# Patient Record
Sex: Female | Born: 1935 | Race: White | Hispanic: No | Marital: Married | Smoking: Former smoker
Health system: Southern US, Community
[De-identification: ages and names within clinical notes are randomized; demographics above are authoritative.]

## PROBLEM LIST (undated history)

## (undated) DIAGNOSIS — L89159 Pressure ulcer of sacral region, unspecified stage: Secondary | ICD-10-CM

## (undated) DIAGNOSIS — E119 Type 2 diabetes mellitus without complications: Secondary | ICD-10-CM

## (undated) DIAGNOSIS — M1990 Unspecified osteoarthritis, unspecified site: Secondary | ICD-10-CM

## (undated) DIAGNOSIS — F0390 Unspecified dementia without behavioral disturbance: Secondary | ICD-10-CM

---

## 2006-04-04 ENCOUNTER — Encounter: Payer: Self-pay | Admitting: Internal Medicine

## 2006-04-30 ENCOUNTER — Encounter: Payer: Self-pay | Admitting: Internal Medicine

## 2006-05-31 ENCOUNTER — Encounter: Payer: Self-pay | Admitting: Internal Medicine

## 2006-06-30 ENCOUNTER — Encounter: Payer: Self-pay | Admitting: Internal Medicine

## 2006-07-31 ENCOUNTER — Encounter: Payer: Self-pay | Admitting: Internal Medicine

## 2006-12-11 ENCOUNTER — Encounter: Payer: Self-pay | Admitting: Internal Medicine

## 2006-12-30 ENCOUNTER — Encounter: Payer: Self-pay | Admitting: Internal Medicine

## 2009-10-16 ENCOUNTER — Ambulatory Visit: Payer: Self-pay | Admitting: Ophthalmology

## 2009-10-23 ENCOUNTER — Ambulatory Visit: Payer: Self-pay | Admitting: Ophthalmology

## 2013-09-20 ENCOUNTER — Ambulatory Visit: Payer: Self-pay | Admitting: Podiatry

## 2014-07-14 ENCOUNTER — Encounter: Payer: Self-pay | Admitting: Podiatry

## 2014-07-14 ENCOUNTER — Ambulatory Visit (INDEPENDENT_AMBULATORY_CARE_PROVIDER_SITE_OTHER): Payer: Medicare Other | Admitting: Podiatry

## 2014-07-14 VITALS — BP 114/73 | HR 86 | Resp 16 | Ht 64.0 in | Wt 157.0 lb

## 2014-07-14 DIAGNOSIS — B351 Tinea unguium: Secondary | ICD-10-CM

## 2014-07-14 DIAGNOSIS — M79676 Pain in unspecified toe(s): Secondary | ICD-10-CM

## 2014-07-14 NOTE — Patient Instructions (Signed)
Diabetes and Foot Care Diabetes may cause you to have problems because of poor blood supply (circulation) to your feet and legs. This may cause the skin on your feet to become thinner, break easier, and heal more slowly. Your skin may become dry, and the skin may peel and crack. You may also have nerve damage in your legs and feet causing decreased feeling in them. You may not notice minor injuries to your feet that could lead to infections or more serious problems. Taking care of your feet is one of the most important things you can do for yourself.  HOME CARE INSTRUCTIONS  Wear shoes at all times, even in the house. Do not go barefoot. Bare feet are easily injured.  Check your feet daily for blisters, cuts, and redness. If you cannot see the bottom of your feet, use a mirror or ask someone for help.  Wash your feet with warm water (do not use hot water) and mild soap. Then pat your feet and the areas between your toes until they are completely dry. Do not soak your feet as this can dry your skin.  Apply a moisturizing lotion or petroleum jelly (that does not contain alcohol and is unscented) to the skin on your feet and to dry, brittle toenails. Do not apply lotion between your toes.  Trim your toenails straight across. Do not dig under them or around the cuticle. File the edges of your nails with an emery board or nail file.  Do not cut corns or calluses or try to remove them with medicine.  Wear clean socks or stockings every day. Make sure they are not too tight. Do not wear knee-high stockings since they may decrease blood flow to your legs.  Wear shoes that fit properly and have enough cushioning. To break in new shoes, wear them for just a few hours a day. This prevents you from injuring your feet. Always look in your shoes before you put them on to be sure there are no objects inside.  Do not cross your legs. This may decrease the blood flow to your feet.  If you find a minor scrape,  cut, or break in the skin on your feet, keep it and the skin around it clean and dry. These areas may be cleansed with mild soap and water. Do not cleanse the area with peroxide, alcohol, or iodine.  When you remove an adhesive bandage, be sure not to damage the skin around it.  If you have a wound, look at it several times a day to make sure it is healing.  Do not use heating pads or hot water bottles. They may burn your skin. If you have lost feeling in your feet or legs, you may not know it is happening until it is too late.  Make sure your health care provider performs a complete foot exam at least annually or more often if you have foot problems. Report any cuts, sores, or bruises to your health care provider immediately. SEEK MEDICAL CARE IF:   You have an injury that is not healing.  You have cuts or breaks in the skin.  You have an ingrown nail.  You notice redness on your legs or feet.  You feel burning or tingling in your legs or feet.  You have pain or cramps in your legs and feet.  Your legs or feet are numb.  Your feet always feel cold. SEEK IMMEDIATE MEDICAL CARE IF:   There is increasing redness,   swelling, or pain in or around a wound.  There is a red line that goes up your leg.  Pus is coming from a wound.  You develop a fever or as directed by your health care provider.  You notice a bad smell coming from an ulcer or wound. Document Released: 09/13/2000 Document Revised: 05/19/2013 Document Reviewed: 02/23/2013 ExitCare Patient Information 2015 [employer]. This information is not intended to replace advice given to you by your health care provider. Make sure you discuss any questions you have with your health care provider.  

## 2014-07-17 NOTE — Progress Notes (Signed)
Patient ID: Claire Williams, female   DOB: 1934-09-26, 80 y.o.   MRN: [id number]  Subjective: Ms. Claire Williams, 80 year old female, presents the office they for diabetic risk assessment and for painful elongated nails. Nails are particularly painful with shoe gear. Denies any acute changes since last appointment. No other complaints at this time.  Objective: AAO x3, NAD DP/PT pulses palpable bilaterally, CRT less than 3 seconds Protective sensation intact with Simms Weinstein monofilament, vibratory sensation intact, Achilles tendon reflex intact Nails hypertrophic, dystrophic, elongated, brittle. No surrounding erythema or drainage. No open lesions. No calf pain, swelling, warmth.  Assessment: 80 year old female with symptomatic onychomycosis  Plan: -Various she options discussed including alternatives, risks, complications. -Nail sharply debrided x10 without complications. -Discussed the importance of daily foot inspection. -Followup in 3 months or sooner if any problems are to arise or any changes symptoms. In the meantime call the office with any questions, concerns

## 2014-10-13 ENCOUNTER — Ambulatory Visit: Payer: Medicare Other | Admitting: Podiatry

## 2014-12-01 ENCOUNTER — Ambulatory Visit: Payer: Self-pay | Admitting: Podiatry

## 2015-03-21 ENCOUNTER — Ambulatory Visit (INDEPENDENT_AMBULATORY_CARE_PROVIDER_SITE_OTHER): Payer: Medicare Other | Admitting: Podiatry

## 2015-03-21 DIAGNOSIS — M79676 Pain in unspecified toe(s): Secondary | ICD-10-CM | POA: Diagnosis not present

## 2015-03-21 DIAGNOSIS — B351 Tinea unguium: Secondary | ICD-10-CM

## 2015-03-21 NOTE — Progress Notes (Signed)
Patient ID: Claire Williams, female   DOB: 04-12-1934, 80 y.o.   MRN: [id number]  Subjective: Claire Williams, 81 year old female, returns to the office today for diabetic risk assessment and for painful elongated nails which she is unable to trim herself. She denies any redness or drainage along the nail sites.  Denies any acute changes since last appointment. No other complaints at this time.  Objective: AAO x3, NAD DP/PT pulses palpable bilaterally, CRT less than 3 seconds Protective sensation intact with Simms Weinstein monofilament, vibratory sensation intact, Achilles tendon reflex intact Nails hypertrophic, dystrophic, elongated, brittle x 10. There is no surrounding erythema or drainage along the nail sites. There is tenderness to the nails 1-5 bilaterally.  No open lesions or pre-ulcerative lesions bilaterally.  No calf pain with compression, swelling, warmth, erythema.   Assessment: 81 year old female with symptomatic onychomycosis  Plan: -Various she options discussed including alternatives, risks, complications. -Nail sharply debrided x10 without complications/bleeding.  -Discussed the importance of daily foot inspection. -Followup in 3 months or sooner if any problems are to arise or any changes symptoms. In the meantime call the office with any questions, concerns  Ovid Curd, DPM

## 2015-05-05 ENCOUNTER — Telehealth: Payer: Self-pay | Admitting: *Deleted

## 2015-05-05 NOTE — Telephone Encounter (Signed)
Ms. Claire Williams states pt's husband forgot to mention at the last visit, pt has toe fungus.  Please call rx to [facility].

## 2015-05-08 MED ORDER — CICLOPIROX 8 % EX SOLN: Freq: Every day | CUTANEOUS | Status: AC

## 2015-05-08 NOTE — Telephone Encounter (Signed)
Sent over Rx for Penlac to pharmacy and notified patients caregiver, Bonita Quin.

## 2015-05-08 NOTE — Telephone Encounter (Signed)
Please advise 

## 2015-05-08 NOTE — Addendum Note (Signed)
Addended by: Kristian Covey on: 05/08/2015 02:44 PM   Modules accepted: Orders

## 2015-05-08 NOTE — Telephone Encounter (Signed)
Can try penlac or OTC fungi-nail

## 2015-06-26 ENCOUNTER — Ambulatory Visit (INDEPENDENT_AMBULATORY_CARE_PROVIDER_SITE_OTHER): Payer: Medicare Other | Admitting: Podiatry

## 2015-06-26 DIAGNOSIS — B351 Tinea unguium: Secondary | ICD-10-CM

## 2015-06-26 DIAGNOSIS — M79676 Pain in unspecified toe(s): Secondary | ICD-10-CM | POA: Diagnosis not present

## 2015-06-26 NOTE — Progress Notes (Signed)
Patient ID: Claire Williams, female   DOB: 09-03-1934, 81 y.o.   MRN: [id number]  Subjective: Claire Williams, 81 year old female, returns to the office today for diabetic risk assessment and for painful elongated nails which she is unable to trim herself. She denies any redness or drainage along the nail sites.  Denies any acute changes since last appointment. No other complaints at this time.  Objective: AAO x3, NAD DP/PT pulses palpable bilaterally, CRT less than 3 seconds Protective sensation intact with Simms Weinstein monofilament, vibratory sensation intact, Achilles tendon reflex intact Nails hypertrophic, dystrophic, elongated, brittle x 10. There is no surrounding erythema or drainage along the nail sites. There is tenderness to the nails 1-5 bilaterally.  No open lesions or pre-ulcerative lesions bilaterally.  No calf pain with compression, swelling, warmth, erythema.   Assessment: 81 year old female with symptomatic onychomycosis  Plan: -Various she options discussed including alternatives, risks, complications. -Nail sharply debrided x10 without complications/bleeding.  -Discussed the importance of daily foot inspection. -Followup in 3 months or sooner if any problems are to arise or any changes symptoms. In the meantime call the office with any questions, concerns  3 mo.  Arbutus Ped DPM

## 2015-09-27 ENCOUNTER — Encounter: Payer: Medicare Other | Admitting: Podiatry

## 2015-09-27 ENCOUNTER — Encounter: Payer: Self-pay | Admitting: Podiatry

## 2015-09-27 NOTE — Progress Notes (Signed)
This encounter was created in error - please disregard.

## 2016-06-25 ENCOUNTER — Encounter: Payer: Self-pay | Admitting: Podiatry

## 2016-06-25 ENCOUNTER — Ambulatory Visit (INDEPENDENT_AMBULATORY_CARE_PROVIDER_SITE_OTHER): Payer: Medicare Other | Admitting: Podiatry

## 2016-06-25 DIAGNOSIS — L609 Nail disorder, unspecified: Secondary | ICD-10-CM

## 2016-06-25 DIAGNOSIS — M79609 Pain in unspecified limb: Principal | ICD-10-CM

## 2016-06-25 DIAGNOSIS — M79676 Pain in unspecified toe(s): Secondary | ICD-10-CM

## 2016-06-25 DIAGNOSIS — L603 Nail dystrophy: Secondary | ICD-10-CM

## 2016-06-25 DIAGNOSIS — B351 Tinea unguium: Secondary | ICD-10-CM

## 2016-06-25 DIAGNOSIS — L608 Other nail disorders: Secondary | ICD-10-CM

## 2016-06-25 NOTE — Progress Notes (Signed)
SUBJECTIVE Patient  presents to office today complaining of elongated, thickened nails. Pain while ambulating in shoes. Patient is unable to trim their own nails.   OBJECTIVE General Patient is awake, alert, and oriented x 3 and in no acute distress. Derm Skin is dry and supple bilateral. Negative open lesions or macerations. Remaining integument unremarkable. Nails are tender, long, thickened and dystrophic with subungual debris, consistent with onychomycosis, 1-5 bilateral. No signs of infection noted. Vasc  DP and PT pedal pulses palpable bilaterally. Temperature gradient within normal limits.  Neuro Epicritic and protective threshold sensation diminished bilaterally.  Musculoskeletal Exam No symptomatic pedal deformities noted bilateral. Muscular strength within normal limits.  ASSESSMENT 1. Onychodystrophic nails 1-5 bilateral with hyperkeratosis of nails.  2. Onychomycosis of nail due to dermatophyte bilateral 3. Pain in foot bilateral  PLAN OF CARE 1. Patient evaluated today.  2. Instructed to maintain good pedal hygiene and foot care.  3. Mechanical debridement of nails 1-5 bilaterally performed using a nail nipper. Filed with dremel without incident.  4. Return to clinic in 3 mos.    Amiayah Giebel M Reata Petrov, DPM    

## 2016-09-24 ENCOUNTER — Ambulatory Visit: Payer: Medicare Other | Admitting: Podiatry

## 2016-11-21 ENCOUNTER — Ambulatory Visit: Payer: Medicare Other | Admitting: Podiatry

## 2016-11-29 ENCOUNTER — Ambulatory Visit: Payer: Medicare Other | Admitting: Podiatry

## 2016-12-10 ENCOUNTER — Ambulatory Visit: Payer: Medicare Other | Admitting: Podiatry

## 2016-12-31 ENCOUNTER — Ambulatory Visit (INDEPENDENT_AMBULATORY_CARE_PROVIDER_SITE_OTHER): Payer: Medicare Other | Admitting: Podiatry

## 2016-12-31 DIAGNOSIS — L603 Nail dystrophy: Secondary | ICD-10-CM

## 2016-12-31 DIAGNOSIS — E0843 Diabetes mellitus due to underlying condition with diabetic autonomic (poly)neuropathy: Secondary | ICD-10-CM

## 2016-12-31 DIAGNOSIS — L608 Other nail disorders: Secondary | ICD-10-CM

## 2016-12-31 DIAGNOSIS — M79609 Pain in unspecified limb: Secondary | ICD-10-CM | POA: Diagnosis not present

## 2016-12-31 DIAGNOSIS — B351 Tinea unguium: Secondary | ICD-10-CM

## 2017-01-02 NOTE — Progress Notes (Signed)
   SUBJECTIVE Patient with a history of diabetes mellitus presents to office today complaining of elongated, thickened nails. Pain while ambulating in shoes. Patient is unable to trim their own nails.   OBJECTIVE General Patient is awake, alert, and oriented x 3 and in no acute distress. Derm Skin is dry and supple bilateral. Negative open lesions or macerations. Remaining integument unremarkable. Nails are tender, long, thickened and dystrophic with subungual debris, consistent with onychomycosis, 1-5 bilateral. No signs of infection noted. Vasc  DP and PT pedal pulses palpable bilaterally. Temperature gradient within normal limits.  Neuro Epicritic and protective threshold sensation diminished bilaterally.  Musculoskeletal Exam No symptomatic pedal deformities noted bilateral. Muscular strength within normal limits.  ASSESSMENT 1. Diabetes Mellitus w/ peripheral neuropathy 2. Onychomycosis of nail due to dermatophyte bilateral 3. Pain in foot bilateral  PLAN OF CARE 1. Patient evaluated today. 2. Instructed to maintain good pedal hygiene and foot care. Stressed importance of controlling blood sugar.  3. Mechanical debridement of nails 1-5 bilaterally performed using a nail nipper. Filed with dremel without incident.  4. Return to clinic in 3 mos.     Brent M. Evans, DPM [facility]  Dr. Brent M. Evans, DPM    [address]                                        [place], [place] [address]                Office [phone]  Fax [phone]       

## 2017-04-03 ENCOUNTER — Ambulatory Visit (INDEPENDENT_AMBULATORY_CARE_PROVIDER_SITE_OTHER): Payer: Medicare Other | Admitting: Podiatry

## 2017-04-03 ENCOUNTER — Encounter: Payer: Self-pay | Admitting: Podiatry

## 2017-04-03 DIAGNOSIS — M79609 Pain in unspecified limb: Secondary | ICD-10-CM | POA: Diagnosis not present

## 2017-04-03 DIAGNOSIS — B351 Tinea unguium: Secondary | ICD-10-CM

## 2017-04-03 NOTE — Progress Notes (Signed)
Complaint:  Visit Type: Patient returns to my office for continued preventative foot care services. Complaint: Patient states" my nails have grown long and thick and become painful to walk and wear shoes" Patient has been diagnosed with borderline  DM with no foot complications. The patient presents for preventative foot care services. No changes to ROS  Podiatric Exam: Vascular: dorsalis pedis and posterior tibial pulses are barely  palpable bilateral. Capillary return is immediate. Temperature gradient is WNL. Skin turgor WNL  Sensorium: Normal Semmes Weinstein monofilament test. Normal tactile sensation bilaterally. Nail Exam: Pt has thick disfigured discolored nails with subungual debris noted bilateral entire nail hallux through fifth toenails Ulcer Exam: There is no evidence of ulcer or pre-ulcerative changes or infection. Orthopedic Exam: Muscle tone and strength are WNL. No limitations in general ROM. No crepitus or effusions noted. Foot type and digits show no abnormalities. Dorsal  DJD both feet. Skin: No Porokeratosis. No infection or ulcers  Diagnosis:  Onychomycosis, , Pain in right toe, pain in left toes  Treatment & Plan Procedures and Treatment: Consent by patient was obtained for treatment procedures. The patient understood the discussion of treatment and procedures well. All questions were answered thoroughly reviewed. Debridement of mycotic and hypertrophic toenails, 1 through 5 bilateral and clearing of subungual debris. No ulceration, no infection noted.  Return Visit-Office Procedure: Patient instructed to return to the office for a follow up visit 4 months for continued evaluation and treatment.    Helane GuntherGregory Khali Albanese DPM

## 2017-07-07 ENCOUNTER — Ambulatory Visit: Payer: Medicare Other | Admitting: Podiatry

## 2017-11-10 ENCOUNTER — Ambulatory Visit: Payer: Medicare Other | Admitting: Podiatry

## 2018-02-26 ENCOUNTER — Ambulatory Visit: Payer: Medicare Other | Admitting: Podiatry

## 2018-03-02 ENCOUNTER — Encounter

## 2018-03-02 ENCOUNTER — Encounter: Payer: Self-pay | Admitting: Podiatry

## 2018-03-02 ENCOUNTER — Ambulatory Visit (INDEPENDENT_AMBULATORY_CARE_PROVIDER_SITE_OTHER): Payer: Medicare Other | Admitting: Podiatry

## 2018-03-02 DIAGNOSIS — M79609 Pain in unspecified limb: Secondary | ICD-10-CM

## 2018-03-02 DIAGNOSIS — B351 Tinea unguium: Secondary | ICD-10-CM

## 2018-03-02 NOTE — Progress Notes (Signed)
Complaint:  Visit Type: Patient returns to my office for continued preventative foot care services. Complaint: Patient states" my nails have grown long and thick and become painful to walk and wear shoes" Patient has been diagnosed with borderline  DM with no foot complications. The patient presents for preventative foot care services. No changes to ROS  Podiatric Exam: Vascular: dorsalis pedis and posterior tibial pulses are barely  palpable bilateral. Capillary return is immediate. Temperature gradient is WNL. Skin turgor WNL  Sensorium: Normal Semmes Weinstein monofilament test. Normal tactile sensation bilaterally. Nail Exam: Pt has thick disfigured discolored nails with subungual debris noted bilateral entire nail hallux through fifth toenails Ulcer Exam: There is no evidence of ulcer or pre-ulcerative changes or infection. Orthopedic Exam: Muscle tone and strength are WNL. No limitations in general ROM. No crepitus or effusions noted. Foot type and digits show no abnormalities. Dorsal  DJD both feet. Skin: No Porokeratosis. No infection or ulcers  Diagnosis:  Onychomycosis, , Pain in right toe, pain in left toes  Treatment & Plan Procedures and Treatment: Consent by patient was obtained for treatment procedures. The patient understood the discussion of treatment and procedures well. All questions were answered thoroughly reviewed. Debridement of mycotic and hypertrophic toenails, 1 through 5 bilateral and clearing of subungual debris. No ulceration, no infection noted.  Return Visit-Office Procedure: Patient instructed to return to the office for a follow up visit prn  for continued evaluation and treatment.    Helane GuntherGregory Aveon Colquhoun DPM

## 2018-03-28 ENCOUNTER — Inpatient Hospital Stay
Admission: EM | Admit: 2018-03-28 | Discharge: 2018-03-31 | DRG: 689 | Disposition: A | Discharge status: Home / Self Care | Payer: Medicare Other | Attending: Internal Medicine | Admitting: Internal Medicine

## 2018-03-28 ENCOUNTER — Emergency Department: Payer: Medicare Other

## 2018-03-28 ENCOUNTER — Other Ambulatory Visit: Payer: Self-pay

## 2018-03-28 ENCOUNTER — Encounter: Payer: Self-pay | Admitting: Emergency Medicine

## 2018-03-28 DIAGNOSIS — Z91012 Allergy to eggs: Secondary | ICD-10-CM | POA: Diagnosis not present

## 2018-03-28 DIAGNOSIS — Z887 Allergy status to serum and vaccine status: Secondary | ICD-10-CM

## 2018-03-28 DIAGNOSIS — Z79899 Other long term (current) drug therapy: Secondary | ICD-10-CM

## 2018-03-28 DIAGNOSIS — Z882 Allergy status to sulfonamides status: Secondary | ICD-10-CM

## 2018-03-28 DIAGNOSIS — E43 Unspecified severe protein-calorie malnutrition: Secondary | ICD-10-CM | POA: Diagnosis present

## 2018-03-28 DIAGNOSIS — Z7401 Bed confinement status: Secondary | ICD-10-CM | POA: Diagnosis not present

## 2018-03-28 DIAGNOSIS — Z66 Do not resuscitate: Secondary | ICD-10-CM | POA: Diagnosis not present

## 2018-03-28 DIAGNOSIS — G9341 Metabolic encephalopathy: Secondary | ICD-10-CM | POA: Diagnosis present

## 2018-03-28 DIAGNOSIS — Z87891 Personal history of nicotine dependence: Secondary | ICD-10-CM

## 2018-03-28 DIAGNOSIS — E119 Type 2 diabetes mellitus without complications: Secondary | ICD-10-CM | POA: Diagnosis present

## 2018-03-28 DIAGNOSIS — Z6821 Body mass index (BMI) 21.0-21.9, adult: Secondary | ICD-10-CM | POA: Diagnosis not present

## 2018-03-28 DIAGNOSIS — Z515 Encounter for palliative care: Secondary | ICD-10-CM | POA: Diagnosis not present

## 2018-03-28 DIAGNOSIS — R627 Adult failure to thrive: Secondary | ICD-10-CM | POA: Diagnosis present

## 2018-03-28 DIAGNOSIS — F329 Major depressive disorder, single episode, unspecified: Secondary | ICD-10-CM | POA: Diagnosis present

## 2018-03-28 DIAGNOSIS — N390 Urinary tract infection, site not specified: Secondary | ICD-10-CM | POA: Diagnosis present

## 2018-03-28 DIAGNOSIS — E871 Hypo-osmolality and hyponatremia: Secondary | ICD-10-CM | POA: Diagnosis present

## 2018-03-28 DIAGNOSIS — E860 Dehydration: Secondary | ICD-10-CM | POA: Diagnosis present

## 2018-03-28 DIAGNOSIS — F0390 Unspecified dementia without behavioral disturbance: Secondary | ICD-10-CM | POA: Diagnosis present

## 2018-03-28 DIAGNOSIS — Z7982 Long term (current) use of aspirin: Secondary | ICD-10-CM | POA: Diagnosis not present

## 2018-03-28 DIAGNOSIS — M1990 Unspecified osteoarthritis, unspecified site: Secondary | ICD-10-CM | POA: Diagnosis present

## 2018-03-28 DIAGNOSIS — Z7189 Other specified counseling: Secondary | ICD-10-CM | POA: Diagnosis not present

## 2018-03-28 DIAGNOSIS — R4182 Altered mental status, unspecified: Secondary | ICD-10-CM

## 2018-03-28 HISTORY — DX: Type 2 diabetes mellitus without complications: E11.9

## 2018-03-28 HISTORY — DX: Unspecified osteoarthritis, unspecified site: M19.90

## 2018-03-28 HISTORY — DX: Unspecified dementia, unspecified severity, without behavioral disturbance, psychotic disturbance, mood disturbance, and anxiety: F03.90

## 2018-03-28 HISTORY — DX: Pressure ulcer of sacral region, unspecified stage: L89.159

## 2018-03-28 LAB — URINALYSIS, COMPLETE (UACMP) WITH MICROSCOPIC
BILIRUBIN URINE: NEGATIVE
Glucose, UA: NEGATIVE mg/dL
KETONES UR: NEGATIVE mg/dL
Nitrite: NEGATIVE
PH: 5 (ref 5.0–8.0)
Protein, ur: 100 mg/dL — AB
RBC / HPF: >50 RBC/hpf — ABNORMAL HIGH (ref 0–5)
SPECIFIC GRAVITY, URINE: 1.011 (ref 1.005–1.030)
Squamous Epithelial / LPF: NONE SEEN (ref 0–5)

## 2018-03-28 LAB — COMPREHENSIVE METABOLIC PANEL
ALBUMIN: 3.4 g/dL — AB (ref 3.5–5.0)
ALT: 13 U/L (ref 0–44)
ANION GAP: 9 (ref 5–15)
AST: 23 U/L (ref 15–41)
Alkaline Phosphatase: 65 U/L (ref 38–126)
BILIRUBIN TOTAL: 0.9 mg/dL (ref 0.3–1.2)
BUN: 27 mg/dL — AB (ref 8–23)
CHLORIDE: 109 mmol/L (ref 98–111)
CO2: 24 mmol/L (ref 22–32)
Calcium: 9.3 mg/dL (ref 8.9–10.3)
Creatinine, Ser: 1.56 mg/dL — ABNORMAL HIGH (ref 0.44–1.00)
GFR calc Af Amer: 35 mL/min — ABNORMAL LOW (ref >60)
GFR, EST NON AFRICAN AMERICAN: 30 mL/min — AB (ref >60)
GLUCOSE: 123 mg/dL — AB (ref 70–99)
Potassium: 3.9 mmol/L (ref 3.5–5.1)
SODIUM: 142 mmol/L (ref 135–145)
TOTAL PROTEIN: 6.8 g/dL (ref 6.5–8.1)

## 2018-03-28 LAB — CBC WITH DIFFERENTIAL/PLATELET
BASOS PCT: 1 %
Basophils Absolute: 0.1 10*3/uL (ref 0–0.1)
EOS ABS: 0.2 10*3/uL (ref 0–0.7)
EOS PCT: 2 %
HCT: 39.3 % (ref 35.0–47.0)
HEMOGLOBIN: 13.2 g/dL (ref 12.0–16.0)
LYMPHS ABS: 1.2 10*3/uL (ref 1.0–3.6)
Lymphocytes Relative: 14 %
MCH: 30.5 pg (ref 26.0–34.0)
MCHC: 33.7 g/dL (ref 32.0–36.0)
MCV: 90.3 fL (ref 80.0–100.0)
MONO ABS: 0.7 10*3/uL (ref 0.2–0.9)
MONOS PCT: 8 %
Neutro Abs: 6.2 10*3/uL (ref 1.4–6.5)
Neutrophils Relative %: 75 %
PLATELETS: 246 10*3/uL (ref 150–440)
RBC: 4.35 MIL/uL (ref 3.80–5.20)
RDW: 12.9 % (ref 11.5–14.5)
WBC: 8.3 10*3/uL (ref 3.6–11.0)

## 2018-03-28 LAB — TSH: TSH: 0.316 u[IU]/mL — ABNORMAL LOW (ref 0.350–4.500)

## 2018-03-28 LAB — TROPONIN I: Troponin I: <0.03 ng/mL (ref <0.03)

## 2018-03-28 MED ORDER — ACETAMINOPHEN 325 MG PO TABS: 650.0000 mg | ORAL_TABLET | Freq: Four times a day (QID) | ORAL | Status: DC | PRN

## 2018-03-28 MED ORDER — SODIUM CHLORIDE 0.9 % IV SOLN
1.0000 g | Freq: Once | INTRAVENOUS | Status: AC
  Administered 2018-03-28: 1 g via INTRAVENOUS
  Filled 2018-03-28: qty 10

## 2018-03-28 MED ORDER — SODIUM CHLORIDE 0.9 % IV BOLUS
1000.0000 mL | Freq: Once | INTRAVENOUS | Status: AC
  Administered 2018-03-28: 1000 mL via INTRAVENOUS

## 2018-03-28 MED ORDER — POLYETHYLENE GLYCOL 3350 17 G PO PACK
17.0000 g | PACK | Freq: Every day | ORAL | Status: DC | PRN
  Administered 2018-03-29: 17 g via ORAL
  Filled 2018-03-28: qty 1

## 2018-03-28 MED ORDER — ONDANSETRON HCL 4 MG PO TABS: 4.0000 mg | ORAL_TABLET | Freq: Four times a day (QID) | ORAL | Status: DC | PRN

## 2018-03-28 MED ORDER — ONDANSETRON HCL 4 MG/2ML IJ SOLN
4.0000 mg | Freq: Four times a day (QID) | INTRAMUSCULAR | Status: DC | PRN
  Administered 2018-03-29: 4 mg via INTRAVENOUS
  Filled 2018-03-28: qty 2

## 2018-03-28 MED ORDER — ENSURE ENLIVE PO LIQD
237.0000 mL | Freq: Two times a day (BID) | ORAL | Status: DC
  Administered 2018-03-29: 237 mL via ORAL

## 2018-03-28 MED ORDER — SODIUM CHLORIDE 0.9 % IV SOLN
INTRAVENOUS | Status: DC
  Administered 2018-03-28 – 2018-03-29 (×2): via INTRAVENOUS

## 2018-03-28 MED ORDER — BUPROPION HCL ER (XL) 150 MG PO TB24
300.0000 mg | ORAL_TABLET | Freq: Every day | ORAL | Status: DC
  Administered 2018-03-28 – 2018-03-31 (×4): 300 mg via ORAL
  Filled 2018-03-28 (×4): qty 2

## 2018-03-28 MED ORDER — SIMVASTATIN 40 MG PO TABS
40.0000 mg | ORAL_TABLET | Freq: Every day | ORAL | Status: DC
  Administered 2018-03-28 – 2018-03-30 (×3): 40 mg via ORAL
  Filled 2018-03-28 (×4): qty 1

## 2018-03-28 MED ORDER — ENOXAPARIN SODIUM 30 MG/0.3ML ~~LOC~~ SOLN
30.0000 mg | SUBCUTANEOUS | Status: DC
  Administered 2018-03-28 – 2018-03-30 (×3): 30 mg via SUBCUTANEOUS
  Filled 2018-03-28 (×3): qty 0.3

## 2018-03-28 MED ORDER — MEMANTINE HCL 5 MG PO TABS
10.0000 mg | ORAL_TABLET | Freq: Every day | ORAL | Status: DC
  Administered 2018-03-28 – 2018-03-31 (×4): 10 mg via ORAL
  Filled 2018-03-28 (×4): qty 2

## 2018-03-28 MED ORDER — DOCUSATE SODIUM 100 MG PO CAPS
100.0000 mg | ORAL_CAPSULE | Freq: Two times a day (BID) | ORAL | Status: DC
  Administered 2018-03-28: 100 mg via ORAL
  Filled 2018-03-28 (×3): qty 1

## 2018-03-28 MED ORDER — SODIUM CHLORIDE 0.9 % IV SOLN
1.0000 g | INTRAVENOUS | Status: DC
  Administered 2018-03-29 – 2018-03-31 (×3): 1 g via INTRAVENOUS
  Filled 2018-03-28: qty 10
  Filled 2018-03-28 (×3): qty 1

## 2018-03-28 MED ORDER — NYSTATIN 100000 UNIT/GM EX POWD
1.0000 g | Freq: Two times a day (BID) | CUTANEOUS | Status: DC | PRN
  Filled 2018-03-28: qty 15

## 2018-03-28 NOTE — ED Provider Notes (Signed)
[facility] Emergency Department Provider Note ____________________________________________   First MD Initiated Contact with Patient 03/28/18 1348     (approximate)  I have reviewed the triage vital signs and the nursing notes.   HISTORY  Chief Complaint Altered Mental Status  HPI Icess Bertoni is a 84 y.o. female history of dementia and arthritis was presenting with progressive worsening/decline mental status over the past 2 weeks.  Family says that she is less responsive and talkative and also has not been eating over the past 2 days.  Patient unable to give detailed history, likely secondary to altered mental status in combination with dementia.  Past Medical History:  Diagnosis Date  . Arthritis   . Dementia   . Diabetes mellitus without complication ([place])   . Sacral decubitus ulcer     There are no active problems to display for this patient.   No past surgical history on file.  Prior to Admission medications   Medication Sig Start Date End Date Taking? Authorizing Provider  acetaminophen (TYLENOL) 325 MG tablet Take 650 mg by mouth.    [provider]  aspirin (ASPIRIN EC LO-DOSE) 81 MG EC tablet Take 81 mg by mouth. 10/20/12   [provider]  Blood Glucose Monitoring Suppl (BAYER CONTOUR MONITOR) W/DEVICE KIT by Other route once. for 1 dose Use to check blood sugars. ICD-9 250.00 12/13/14   [provider]  buPROPion (WELLBUTRIN XL) 300 MG 24 hr tablet Take 300 mg by mouth. 02/17/15   [provider]  CALCIUM PO Take 1,000 mg by mouth. 10/20/12   [provider]  ciclopirox (PENLAC) 8 % solution Apply topically at bedtime. Apply to nail/surrounding skin and daily over previous coat. Every 7 days remove with alcohol and continue. 05/08/15   Trula Slade, DPM  clotrimazole (LOTRIMIN) 1 % cream Apply topically.    [provider]  collagenase (SANTYL) ointment Apply topically. 03/03/15    [provider]  diclofenac sodium (VOLTAREN) 1 % GEL Apply 4 grams to the affected knee up to four times a day 12/13/14   [provider]  memantine (NAMENDA) 10 MG tablet Take 10 mg by mouth. 02/16/14   [provider]  nystatin (MYCOSTATIN) powder Apply topically. 12/10/12   [provider]  omeprazole (PRILOSEC) 20 MG capsule Take 20 mg by mouth.    [provider]  simvastatin (ZOCOR) 40 MG tablet Take 40 mg by mouth. 09/05/14 09/05/15  [provider]  Vitamin D, Cholecalciferol, 400 UNITS TABS Take by mouth. 10/20/12   [provider]    Allergies Influenza vaccines; Eggs or egg-derived products; Sulfa antibiotics; and Sulfacetamide sodium  No family history on file.  Social History Social History   Tobacco Use  . Smoking status: Former Research scientist (life sciences)  . Smokeless tobacco: Never Used  Substance Use Topics  . Alcohol use: No  . Drug use: No    Review of Systems  Level 5 caveat secondary to decreased mentation  ____________________________________________   PHYSICAL EXAM:  VITAL SIGNS: ED Triage Vitals  Enc Vitals Group     BP 03/28/18 1348 (!) 169/75     Pulse Rate 03/28/18 1348 72     Resp 03/28/18 1348 (!) 23     Temp 03/28/18 1348 97.8 F (36.6 C)     Temp Source 03/28/18 1348 Oral     SpO2 03/28/18 1348 98 %     Weight 03/28/18 1346 130 lb (59 kg)  Height 03/28/18 1346 5' (1.524 m)     Head Circumference --      Peak Flow --      Pain Score 03/28/18 1348 0     Pain Loc --      Pain Edu? --      Excl. in [place]? --     Constitutional: Alert and oriented to self only.  In no acute distress Eyes: Conjunctivae are normal.  Head: Atraumatic. Nose: No congestion/rhinnorhea. Mouth/Throat: Dry membranes are moist.  Neck: No stridor.   Cardiovascular: Normal rate, regular rhythm. Grossly normal heart sounds.   Respiratory: Normal respiratory effort.  No retractions. Lungs CTAB. Gastrointestinal: Soft and  nontender. No distention. Musculoskeletal: No lower extremity tenderness nor edema.  No joint effusions. Neurologic:  Normal speech and language. No gross focal neurologic deficits are appreciated. Skin:  Skin is warm, dry and intact. No rash noted. Psychiatric: Mood and affect are normal. Speech and behavior are normal.  ____________________________________________   LABS (all labs ordered are listed, but only abnormal results are displayed)  Labs Reviewed  COMPREHENSIVE METABOLIC PANEL - Abnormal; Notable for the following components:      Result Value   Glucose, Bld 123 (*)    BUN 27 (*)    Creatinine, Ser 1.56 (*)    Albumin 3.4 (*)    GFR calc non Af Amer 30 (*)    GFR calc Af Amer 35 (*)    All other components within normal limits  URINALYSIS, COMPLETE (UACMP) WITH MICROSCOPIC - Abnormal; Notable for the following components:   Color, Urine YELLOW (*)    APPearance TURBID (*)    Hgb urine dipstick LARGE (*)    Protein, ur 100 (*)    Leukocytes, UA LARGE (*)    RBC / HPF >50 (*)    WBC, UA >50 (*)    Bacteria, UA MANY (*)    All other components within normal limits  TSH - Abnormal; Notable for the following components:   TSH 0.316 (*)    All other components within normal limits  CBC WITH DIFFERENTIAL/PLATELET  TROPONIN I   ____________________________________________  EKG  ED ECG REPORT I, Doran Stabler, the attending physician, personally viewed and interpreted this ECG.   Date: 03/28/2018  EKG Time: 1341  Rate: 78  Rhythm: normal sinus rhythm  Axis: Normal  Intervals:left anterior fascicular block  ST&T Change: No ST segment elevation or depression.  No abnormal T wave inversion.  ____________________________________________  RADIOLOGY  No acute finding on the chest x-ray nor the CT of the brain. ____________________________________________   PROCEDURES  Procedure(s) performed:   Procedures  Critical Care performed:    ____________________________________________   INITIAL IMPRESSION / ASSESSMENT AND PLAN / ED COURSE  Pertinent labs & imaging results that were available during my care of the patient were reviewed by me and considered in my medical decision making (see chart for details).  Differential diagnosis includes, but is not limited to, alcohol, illicit or prescription medications, or other toxic ingestion; intracranial pathology such as stroke or intracerebral hemorrhage; fever or infectious causes including sepsis; hypoxemia and/or hypercarbia; uremia; trauma; endocrine related disorders such as diabetes, hypoglycemia, and thyroid-related diseases; hypertensive encephalopathy; etc. As part of my medical decision making, I reviewed the following data within the electronic MEDICAL RECORD NUMBER Notes from prior outpatient visits  ----------------------------------------- 3:04 PM on 03/28/2018 -----------------------------------------  Family is now at the bedside.  They reiterated that the patient has had a decreased mentation over  the past 2 weeks and has not been eating.  They say that she is also no longer able to support her weight secondary to generalized weakness.  Positive for UTI.  Will be treated with IV ceftriaxone and will be admitted to the hospital.  Signed out to Dr. Tressia Miners. ____________________________________________   FINAL CLINICAL IMPRESSION(S) / ED DIAGNOSES  UTI.  Altered mental status.  NEW MEDICATIONS STARTED DURING THIS VISIT:  New Prescriptions   No medications on file     Note:  This document was prepared using Dragon voice recognition software and may include unintentional dictation errors.     Orbie Pyo, MD 03/28/18 [phone]

## 2018-03-28 NOTE — Progress Notes (Signed)
   [facility] - [place] [facility]   Advance care planning  Hospital Day: 0 days Claire Williams is a 84 y.o. female presenting with Altered Mental Status .   Advance care planning discussed with patient's husband Mr. Claire Williams, and her daughter at bedside.  All questions in regards to overall condition and expected prognosis answered.  Patient had a living will done when she was doing well before her dementia.  At the time she wanted to be full code and aggressive measures including G-tube placement to be done if she ever needs it. Explained current situation with her husband, he understands that he is the decision maker at this time.  But he needs some more time to discuss things with his children and think about her CODE STATUS. Until then, she will be a full code.  CODE STATUS: Full Code Time spent: 18 minutes

## 2018-03-28 NOTE — Clinical Social Work Note (Signed)
Clinical Social Work Assessment  Patient Details  Name: Claire Williams MRN: [id number] Date of Birth: 02-05-1934  Date of referral:  03/28/18               Reason for consult:  Care Management Concerns, End of Life/Hospice                Permission sought to share information with:  Guardian, Family Supports Permission granted to share information::  No, Yes, Verbal Permission Granted  Name::     Rosine AbeRussel Ovens ( husband) daughter Jettie Boozeina Morales  Agency::     Relationship::     Contact Information:     Housing/Transportation Living arrangements for the past 2 months:  Single Family Home Source of Information:  Adult Children, Spouse Patient Interpreter Needed:  None Criminal Activity/Legal Involvement Pertinent to Current Situation/Hospitalization:  No - Comment as needed Significant Relationships:  Adult Children, Spouse, Church Lives with:  Spouse Do you feel safe going back to the place where you live?  Yes Need for family participation in patient care:  Yes (Comment)  Care giving concerns: Family understands she is failing to thrive and DO NOT want her is a SNF or ALF   Social Worker assessment / plan: LCSW introduced myself to patient and husband and patients daughter. Pt was unresponsive and her husband and daughter agreed to complete assessment to find out what their potential needs are. Patient has been diagnosed with dementia 10 years ago and her husband and daughter have provided on going care over the last 10 years. They have in home health workers 5 hours a day/6 days a week to meet her needs. She requires full assistance with all her ADL's. She has not walked for many years ( use a wheel chair) She is only eating soft foods now and does not eat or drink for last 2 weeks. She has excellent family support ( 2 daughters ) and caring husband. Strong Christian Based faith and support from their [place] family.They have been married for 60 years. He will not place her in a home ( SNF  or ALF) Patient has a hearing aid and wears eye glasses. She has not been responses for a few days now and family has noticed altered mental status and is not responding. They would like a palliative consult and care management consult to ensure she has equipment to support her in home. LCSW introduced information for Dementia Services and they declined as they have been caring for her for 10 years.  Meeting concluded SW asked family if they required refreshments and assisted them as needed. No further needs.  Employment status:  Retired(Used to be a Comptrollerlibrarian) Health and safety inspectornsurance information:  Medicare(BCBS) PT Recommendations:  Not assessed at this time Information / Referral to community resources:     Patient/Family's Response to care: They would like Palliative and Care Management to consult with them  Patient/Family's Understanding of and Emotional Response to Diagnosis, Current Treatment, and Prognosis: Family has a good understanding of patients dementia.  Emotional Assessment Appearance:  Appears stated age, Well-Groomed Attitude/Demeanor/Rapport:  Unable to Assess Affect (typically observed):  Calm, Unable to Assess Orientation:  Oriented to Self Alcohol / Substance use:  Not Applicable Psych involvement (Current and /or in the community):  No (Comment)  Discharge Needs  Concerns to be addressed:  Care Coordination Readmission within the last 30 days:  No Current discharge risk:  None Barriers to Discharge:  No Barriers Identified   Cheron SchaumannBandi, Jadalynn Burr M, LCSW 03/28/2018, 4:14  PM

## 2018-03-28 NOTE — ED Triage Notes (Signed)
Pt from home. Normally a&ox1 but now wont talk x 2 weeks. Pt answered "what is your name" and was able to follow simple commands. Mouth very dry

## 2018-03-28 NOTE — ED Notes (Signed)
Report called to lea

## 2018-03-28 NOTE — Progress Notes (Signed)
LCSW introduced myself to family to assess what patients needs would be in the near future. Family very pleasant and agreeable to partake in an assessment.  They were very clear that Mom was to return home. Currently they have in home health workers 5 hours/6 days/ week and now have increased home care to include Sundays.  LCSW completed assessment and will let Care manager know family is interested in obtaining any medical supplies ( hoist) to assist in her daily care.  No further needs  Delta Air LinesClaudine Lumen Brinlee LCSW [phone]

## 2018-03-28 NOTE — H&P (Signed)
[place] [facility] [place] NAME: Claire Williams    MR#:  [id number]  DATE OF BIRTH:  [birth year]  DATE OF ADMISSION:  03/28/2018  PRIMARY CARE PHYSICIAN: Lianne Moris, MD   REQUESTING/REFERRING PHYSICIAN: Dr. Carrie Mew  CHIEF COMPLAINT:   Chief Complaint  Patient presents with  . Altered Mental Status    HISTORY OF PRESENT ILLNESS:  Claire Williams  is a 84 y.o. female with a known history of worsening dementia, nonverbal at baseline, healing sacral decub ulcer and diet-controlled diabetes mellitus was brought in from home secondary to poor oral intake and altered mental status. Patient is alert, nonverbal.  Most of the history is obtained from her husband and daughter at bedside.  Over the last 2 weeks her intake has been extremely poor.  She is only having bites of applesauce.  She denies any pain, family denies any fevers, nausea or vomiting.  They called her PCPs office about a week ago and were told that this could be her dementia worsening versus an underlying infection.  Recommended to come to the office for urine testing.  They could not make that appointment and it was hard to get the patient out of her bed to even her wheelchair.  They have some aides coming to help during the day but the husband is overwhelmed.  PAST MEDICAL HISTORY:   Past Medical History:  Diagnosis Date  . Arthritis   . Dementia   . Diabetes mellitus without complication ([place])   . Sacral decubitus ulcer     PAST SURGICAL HISTORY:  No past surgical history on file.  SOCIAL HISTORY:   Social History   Tobacco Use  . Smoking status: Former Research scientist (life sciences)  . Smokeless tobacco: Never Used  Substance Use Topics  . Alcohol use: No    FAMILY HISTORY:   Family History  Family history unknown: Yes    DRUG ALLERGIES:   Allergies  Allergen Reactions  . Influenza Vaccines     Egg allergy  . Eggs Or Egg-Derived Products Other (See Comments)  . Sulfa  Antibiotics Rash  . Sulfacetamide Sodium Rash    REVIEW OF SYSTEMS:   Review of Systems  Unable to perform ROS: Dementia    MEDICATIONS AT HOME:   Prior to Admission medications   Medication Sig Start Date End Date Taking? Authorizing Provider  buPROPion (WELLBUTRIN XL) 300 MG 24 hr tablet Take 300 mg by mouth daily.  02/17/15  Yes [provider]  memantine (NAMENDA) 10 MG tablet Take 10 mg by mouth daily.  02/16/14  Yes [provider]  simvastatin (ZOCOR) 40 MG tablet Take 40 mg by mouth at bedtime.  09/05/14 03/28/18 Yes [provider]  acetaminophen (TYLENOL) 325 MG tablet Take 650 mg by mouth every 6 (six) hours as needed.     [provider]  Blood Glucose Monitoring Suppl (BAYER CONTOUR MONITOR) W/DEVICE KIT by Other route once. for 1 dose Use to check blood sugars. ICD-9 250.00 12/13/14   [provider]  ciclopirox (PENLAC) 8 % solution Apply topically at bedtime. Apply to nail/surrounding skin and daily over previous coat. Every 7 days remove with alcohol and continue. Patient not taking: Reported on 03/28/2018 05/08/15   Trula Slade, DPM  nystatin (MYCOSTATIN) powder Apply 1 g topically 2 (two) times daily as needed.  12/10/12   [provider]      VITAL SIGNS:  Blood pressure (!) 157/67, pulse 68, temperature  97.8 F (36.6 C), temperature source Oral, resp. rate 15, height 5' (1.524 m), weight 59 kg (130 lb), SpO2 97 %.  PHYSICAL EXAMINATION:   Physical Exam  GENERAL:  84 y.o.-year-old malnourished patient lying in the bed with no acute distress.  EYES: Pupils equal, round, reactive to light and accommodation. No scleral icterus. Extraocular muscles intact.  HEENT: Head atraumatic, normocephalic. Oropharynx and nasopharynx clear. Dry mucous membranes NECK:  Supple, no jugular venous distention. No thyroid enlargement, no tenderness.  LUNGS: Normal breath sounds bilaterally, no wheezing, rales,rhonchi or  crepitation. No use of accessory muscles of respiration.  CARDIOVASCULAR: S1, S2 normal. No  rubs, or gallops. 2/6 systolic murmur present ABDOMEN: Soft, nontender, nondistended. Bowel sounds present. No organomegaly or mass.  EXTREMITIES: No pedal edema, cyanosis, or clubbing.  NEUROLOGIC: Cranial nerves II through XII are intact. Not following commands Moving all extremities in bed. Sensation intact. Gait not checked.  PSYCHIATRIC: The patient is alert, non verbal SKIN: No obvious rash, lesion, or ulcer.   LABORATORY PANEL:   CBC Recent Labs  Lab 03/28/18 1350  WBC 8.3  HGB 13.2  HCT 39.3  PLT 246   ------------------------------------------------------------------------------------------------------------------  Chemistries  Recent Labs  Lab 03/28/18 1350  NA 142  K 3.9  CL 109  CO2 24  GLUCOSE 123*  BUN 27*  CREATININE 1.56*  CALCIUM 9.3  AST 23  ALT 13  ALKPHOS 65  BILITOT 0.9   ------------------------------------------------------------------------------------------------------------------  Cardiac Enzymes Recent Labs  Lab 03/28/18 1350  TROPONINI <0.03   ------------------------------------------------------------------------------------------------------------------  RADIOLOGY:  Dg Chest 1 View  Result Date: 03/28/2018 CLINICAL DATA:  Decreased level of consciousness. EXAM: CHEST  1 VIEW COMPARISON:  None. FINDINGS: Enlarged cardiac silhouette. Calcific atherosclerotic disease and tortuosity of the aorta. Mediastinal contours appear intact. There is no evidence of focal airspace consolidation, pleural effusion or pneumothorax. Osseous structures are without acute abnormality. Soft tissues are grossly normal. IMPRESSION: Enlarged cardiac silhouette. Calcific atherosclerotic disease and tortuosity of the aorta. Electronically Signed   By: Fidela Salisbury M.D.   On: 03/28/2018 14:37   Ct Head Wo Contrast  Result Date: 03/28/2018 CLINICAL DATA:   History of dementia. Decreased level of consciousness. EXAM: CT HEAD WITHOUT CONTRAST TECHNIQUE: Contiguous axial images were obtained from the base of the skull through the vertex without intravenous contrast. COMPARISON:  None. FINDINGS: Brain: No evidence of acute infarction, hemorrhage, hydrocephalus, extra-axial collection or mass lesion/mass effect. Marked brain parenchymal volume loss and deep white matter microangiopathy. Vascular: Calcific atherosclerotic disease of the intra cavernous carotid arteries. Skull: Normal. Negative for fracture or focal lesion. Sinuses/Orbits: No acute finding. Other: None. IMPRESSION: No acute intracranial abnormality. Marked brain parenchymal volume loss and chronic microvascular disease. Electronically Signed   By: Fidela Salisbury M.D.   On: 03/28/2018 14:37    EKG:   Orders placed or performed during the hospital encounter of 03/28/18  . ED EKG  . ED EKG  . EKG 12-Lead  . EKG 12-Lead    IMPRESSION AND PLAN:   Prakriti Carignan  is a 84 y.o. female with a known history of worsening dementia, nonverbal at baseline, healing sacral decub ulcer and diet-controlled diabetes mellitus was brought in from home secondary to poor oral intake and altered mental status.  1. UTI-follow-up urine cultures -Started on fluids and Rocephin  2.  Altered mental status-could be metabolic encephalopathy on top of dementia and also worsening dementia. -PCP recommended hospice -We discussed again about CODE STATUS with her daughter and husband  at bedside.  They wanted time to think and talk to other family members.  Patient has a living will from the past that states she is a full code. -Palliative care consult requested  3.  Failure to thrive, severe malnutrition-secondary to poor intake from her dementia -Dietary supplements.  4.  Dementia and depression-continue home medications.  Patient on Namenda and Wellbutrin  5.  DVT prophylaxis-Lovenox    All the records  are reviewed and case discussed with ED provider. Management plans discussed with the patient, family and they are in agreement.  CODE STATUS: Full Code  TOTAL TIME TAKING CARE OF THIS PATIENT: 50 minutes.    Gladstone Lighter M.D on 03/28/2018 at 3:51 PM  Between 7am to 6pm - Pager - [phone]  After 6pm go to [email or web address] - password [email or web address] [facility]  Office  [phone]  CC: Primary care physician; Lianne Moris, MD

## 2018-03-28 NOTE — Progress Notes (Signed)
PHARMACIST - PHYSICIAN COMMUNICATION  CONCERNING:  Enoxaparin (Lovenox) for DVT Prophylaxis   RECOMMENDATION: Patient was prescribed enoxaprin 40mg  q24 hours for VTE prophylaxis.   Filed Weights   03/28/18 1346  Weight: 130 lb (59 kg)    Body mass index is 25.39 kg/m.  Estimated Creatinine Clearance: 22.3 mL/min (A) (by C-G formula based on SCr of 1.56 mg/dL (H)).  Patient is candidate for enoxaparin 30mg  every 24 hours based on CrCl <2430ml/min.  DESCRIPTION: Pharmacy has adjusted enoxaparin dose.  Patient is now receiving enoxaparin 30mg  every 24 hours.  Gardner CandleSheema M Anhar Mcdermott, PharmD, BCPS Clinical Pharmacist 03/28/2018 5:11 PM

## 2018-03-29 LAB — CBC
HEMATOCRIT: 35.4 % (ref 35.0–47.0)
Hemoglobin: 12.2 g/dL (ref 12.0–16.0)
MCH: 31.3 pg (ref 26.0–34.0)
MCHC: 34.6 g/dL (ref 32.0–36.0)
MCV: 90.5 fL (ref 80.0–100.0)
Platelets: 203 10*3/uL (ref 150–440)
RBC: 3.91 MIL/uL (ref 3.80–5.20)
RDW: 13.3 % (ref 11.5–14.5)
WBC: 8.3 10*3/uL (ref 3.6–11.0)

## 2018-03-29 LAB — BASIC METABOLIC PANEL
ANION GAP: 7 (ref 5–15)
BUN: 22 mg/dL (ref 8–23)
CALCIUM: 8.7 mg/dL — AB (ref 8.9–10.3)
CO2: 24 mmol/L (ref 22–32)
Chloride: 113 mmol/L — ABNORMAL HIGH (ref 98–111)
Creatinine, Ser: 1.39 mg/dL — ABNORMAL HIGH (ref 0.44–1.00)
GFR, EST AFRICAN AMERICAN: 40 mL/min — AB (ref >60)
GFR, EST NON AFRICAN AMERICAN: 34 mL/min — AB (ref >60)
GLUCOSE: 108 mg/dL — AB (ref 70–99)
POTASSIUM: 3.7 mmol/L (ref 3.5–5.1)
Sodium: 144 mmol/L (ref 135–145)

## 2018-03-29 LAB — URINE CULTURE: CULTURE: NO GROWTH

## 2018-03-29 MED ORDER — ADULT MULTIVITAMIN W/MINERALS CH
1.0000 | ORAL_TABLET | Freq: Every day | ORAL | Status: DC
  Administered 2018-03-30 – 2018-03-31 (×2): 1 via ORAL
  Filled 2018-03-29 (×2): qty 1

## 2018-03-29 MED ORDER — PREMIER PROTEIN SHAKE
11.0000 [oz_av] | Freq: Two times a day (BID) | ORAL | Status: DC
  Administered 2018-03-29 – 2018-03-31 (×4): 11 [oz_av] via ORAL

## 2018-03-29 NOTE — Progress Notes (Signed)
[facility] - [place] at [facility]   PATIENT NAME: Claire Williams    MR#:  [id number]  DATE OF BIRTH:  Apr 05, 1934  SUBJECTIVE:  CHIEF COMPLAINT:   Chief Complaint  Patient presents with  . Altered Mental Status   The patient is demented, noncommunicative. REVIEW OF SYSTEMS:  Review of Systems  Unable to perform ROS: Dementia    DRUG ALLERGIES:   Allergies  Allergen Reactions  . Influenza Vaccines     Egg allergy  . Eggs Or Egg-Derived Products Other (See Comments)  . Sulfa Antibiotics Rash  . Sulfacetamide Sodium Rash   VITALS:  Blood pressure (!) 130/55, pulse 69, temperature 98 F (36.7 C), temperature source Axillary, resp. rate 17, height 5\' 1"  (1.549 m), weight 111 lb 1.8 oz (50.4 kg), SpO2 97 %. PHYSICAL EXAMINATION:  Physical Exam  Constitutional:  Severe malnutrition.  HENT:  Head: Normocephalic.  Eyes: Conjunctivae and EOM are normal. No scleral icterus.  Neck: Neck supple. No JVD present. No tracheal deviation present.  Cardiovascular: Normal rate, regular rhythm and normal heart sounds. Exam reveals no gallop.  No murmur heard. Pulmonary/Chest: Effort normal and breath sounds normal. No respiratory distress. She has no wheezes. She has no rales.  Abdominal: Soft. Bowel sounds are normal. She exhibits no distension. There is no tenderness. There is no rebound.  Musculoskeletal: She exhibits no edema or tenderness.  Neurological: No cranial nerve deficit.  Demented and noncommunicative, unable to exam.  Skin: No rash noted. No erythema.   LABORATORY PANEL:  Female CBC Recent Labs  Lab 03/29/18 0552  WBC 8.3  HGB 12.2  HCT 35.4  PLT 203   ------------------------------------------------------------------------------------------------------------------ Chemistries  Recent Labs  Lab 03/28/18 1350 03/29/18 0552  NA 142 144  K 3.9 3.7  CL 109 113*  CO2 24 24  GLUCOSE 123* 108*  BUN 27* 22  CREATININE 1.56* 1.39*  CALCIUM  9.3 8.7*  AST 23  --   ALT 13  --   ALKPHOS 65  --   BILITOT 0.9  --    RADIOLOGY:  Dg Chest 1 View  Result Date: 03/28/2018 CLINICAL DATA:  Decreased level of consciousness. EXAM: CHEST  1 VIEW COMPARISON:  None. FINDINGS: Enlarged cardiac silhouette. Calcific atherosclerotic disease and tortuosity of the aorta. Mediastinal contours appear intact. There is no evidence of focal airspace consolidation, pleural effusion or pneumothorax. Osseous structures are without acute abnormality. Soft tissues are grossly normal. IMPRESSION: Enlarged cardiac silhouette. Calcific atherosclerotic disease and tortuosity of the aorta. Electronically Signed   By: Ted Mcalpineobrinka  Dimitrova M.D.   On: 03/28/2018 14:37   Ct Head Wo Contrast  Result Date: 03/28/2018 CLINICAL DATA:  History of dementia. Decreased level of consciousness. EXAM: CT HEAD WITHOUT CONTRAST TECHNIQUE: Contiguous axial images were obtained from the base of the skull through the vertex without intravenous contrast. COMPARISON:  None. FINDINGS: Brain: No evidence of acute infarction, hemorrhage, hydrocephalus, extra-axial collection or mass lesion/mass effect. Marked brain parenchymal volume loss and deep white matter microangiopathy. Vascular: Calcific atherosclerotic disease of the intra cavernous carotid arteries. Skull: Normal. Negative for fracture or focal lesion. Sinuses/Orbits: No acute finding. Other: None. IMPRESSION: No acute intracranial abnormality. Marked brain parenchymal volume loss and chronic microvascular disease. Electronically Signed   By: Ted Mcalpineobrinka  Dimitrova M.D.   On: 03/28/2018 14:37   ASSESSMENT AND PLAN:   Claire Squibbancy Eddings  is a 84 y.o. female with a known history of worsening dementia, nonverbal at baseline, healing sacral decub ulcer and  diet-controlled diabetes mellitus was brought in from home secondary to poor oral intake and altered mental status.  1. UTI Continue on fluids and Rocephin, follow-up urine cultures  2.   Altered mental status-acute metabolic encephalopathy on top of dementia and also worsening dementia. -PCP recommended hospice Follow-up palliative care consult.  3.  Failure to thrive, severe malnutrition-secondary to poor intake from her dementia -Dietary supplements.  4.  Dementia and depression-continue home medications.  Patient on Namenda and Wellbutrin  Dehydration, improving with IV fluid support.  The patient is bedbound per family.  All the records are reviewed and case discussed with Care Management/Social Worker. Management plans discussed with the patient, her husband and daughter and they are in agreement.  CODE STATUS: Full Code  TOTAL TIME TAKING CARE OF THIS PATIENT: 33 minutes.   More than 50% of the time was spent in counseling/coordination of care: YES  POSSIBLE D/C IN 2 DAYS, DEPENDING ON CLINICAL CONDITION.   Shaune Pollack M.D on 03/29/2018 at 1:02 PM  Between 7am to 6pm - Pager - [phone]  After 6pm go to [email or web address] - Therapist, [facility] Hospital[facility]

## 2018-03-29 NOTE — Progress Notes (Signed)
Initial Nutrition Assessment  DOCUMENTATION CODES:   Not applicable  INTERVENTION:  Will downgrade diet to dysphagia 2 (fine chop) with thin liquids. Provide extra gravy on meat and potatoes.  Will discontinue Ensure Enlive.  Provide Premier Protein po BID, each supplement provides 160 kcal and 30 grams of protein. Per husband patient prefers to have Premier in afternoon and then before bed.  Provide daily MVI.  Will continue to monitor discussions regarding goals of care.  NUTRITION DIAGNOSIS:   Inadequate oral intake related to decreased appetite, chronic illness(dementia) as evidenced by per patient/family report.  GOAL:   Patient will meet greater than or equal to 90% of their needs  MONITOR:   PO intake, Supplement acceptance, Labs, Weight trends, Skin, I & O's  REASON FOR ASSESSMENT:   Malnutrition Screening Tool    ASSESSMENT:   84 year old female with PMHx of arthritis, DM, dementia, healing sacral decubitus ulcer who was admitted with AMS, UTI, FTT.    -Pending PMT consult.  Met with patient and husband at bedside. Patient was sleeping at time of RD assessment, but is also nonverbal. History obtained from husband. He reports patient has a very poor appetite and is "not eating enough to survive" at this point. He reports this has been going on for 2-3 weeks now. She sleeps frequently during the day, and then when she wakes up mainly takes bites of applesauce. This morning she had bites of oatmeal, bacon, and a bite of a biscuit this morning. She has been chewing food for a long time and husband has to remind her to swallow. He reports she even does this with applesauce. We discussed trying a finely chopped diet (dysphagia 2) and he is amenable to trying this. He reports patient typically drinks Premier Protein during the day and enjoys these. She drinks about 1/2 of a bottle in the afternoon and then another 1/2 bottle before bed.  UBW was 154 lbs and patient last  weighed this about 9 months ago. There is limited weight history in chart so unable to trend. Per husband's report She has lost approximately 43 lbs (27.9% body weight) over the past 9 months.  Medications reviewed and include: Colace, Ensure Enlive BID, NS @ 75 mL/hr, ceftriaxone.  Labs reviewed: Chloride 113, Creatinine 1.39.   RD suspects patient is malnourished. Deferred Nutrition-Focused Physical Exam at this time until goals of care are determined as patient was sleeping with blankets pulled up to chin.  NUTRITION - FOCUSED PHYSICAL EXAM:  Deferred. RD will obtain on follow-up pending goals of care.  Diet Order:   Diet Order           DIET SOFT Room service appropriate? No; Fluid consistency: Thin  Diet effective now          EDUCATION NEEDS:   No education needs have been identified at this time  Skin:  Skin Assessment: Reviewed RN Assessment(generalized ecchymosis)  Last BM:  03/28/2018 - large type 6  Height:   Ht Readings from Last 1 Encounters:  03/28/18 _0  (1.549 m)    Weight:   Wt Readings from Last 1 Encounters:  03/28/18 111 lb 1.8 oz (50.4 kg)    Ideal Body Weight:  47.7 kg  BMI:  Body mass index is 20.99 kg/m.  Estimated Nutritional Needs:   Kcal:  1260-1515 (25-30 kcal/kg)  Protein:  65-75 grams (1.3-1.5 grams/kg)  Fluid:  1.2-1.5 L/day (1 mL/kcal)  Willey Blade, MS, RD, LDN Office: [phone] Pager: [phone] After  Hours/Weekend Pager: [phone]

## 2018-03-30 DIAGNOSIS — Z515 Encounter for palliative care: Secondary | ICD-10-CM

## 2018-03-30 DIAGNOSIS — N390 Urinary tract infection, site not specified: Principal | ICD-10-CM

## 2018-03-30 DIAGNOSIS — Z7189 Other specified counseling: Secondary | ICD-10-CM

## 2018-03-30 DIAGNOSIS — R4182 Altered mental status, unspecified: Secondary | ICD-10-CM

## 2018-03-30 LAB — BASIC METABOLIC PANEL
Anion gap: 4 — ABNORMAL LOW (ref 5–15)
BUN: 19 mg/dL (ref 8–23)
CALCIUM: 8.6 mg/dL — AB (ref 8.9–10.3)
CO2: 24 mmol/L (ref 22–32)
CREATININE: 1.07 mg/dL — AB (ref 0.44–1.00)
Chloride: 118 mmol/L — ABNORMAL HIGH (ref 98–111)
GFR calc non Af Amer: 47 mL/min — ABNORMAL LOW (ref >60)
GFR, EST AFRICAN AMERICAN: 54 mL/min — AB (ref >60)
GLUCOSE: 101 mg/dL — AB (ref 70–99)
Potassium: 4 mmol/L (ref 3.5–5.1)
Sodium: 146 mmol/L — ABNORMAL HIGH (ref 135–145)

## 2018-03-30 LAB — MAGNESIUM: Magnesium: 1.7 mg/dL (ref 1.7–2.4)

## 2018-03-30 MED ORDER — DOCUSATE SODIUM 50 MG/5ML PO LIQD
100.0000 mg | Freq: Two times a day (BID) | ORAL | Status: DC
  Administered 2018-03-30 – 2018-03-31 (×2): 100 mg via ORAL
  Filled 2018-03-30 (×3): qty 10

## 2018-03-30 MED ORDER — DEXTROSE 5 % IV SOLN
INTRAVENOUS | Status: DC
Start: 2018-03-30 — End: 2018-03-31
  Administered 2018-03-30 (×2): via INTRAVENOUS

## 2018-03-30 MED ORDER — MAGNESIUM SULFATE 2 GM/50ML IV SOLN
2.0000 g | Freq: Once | INTRAVENOUS | Status: AC
  Administered 2018-03-30: 2 g via INTRAVENOUS
  Filled 2018-03-30: qty 50

## 2018-03-30 NOTE — Progress Notes (Signed)
[facility] [place] [facility]   PATIENT NAME: Claire Williams    MR#:  [id number]  DATE OF BIRTH:  August 26, 1934  SUBJECTIVE:  CHIEF COMPLAINT:   Chief Complaint  Patient presents with  . Altered Mental Status   The patient is demented, noncommunicative. REVIEW OF SYSTEMS:  Review of Systems  Unable to perform ROS: Dementia    DRUG ALLERGIES:   Allergies  Allergen Reactions  . Influenza Vaccines     Egg allergy  . Eggs Or Egg-Derived Products Other (See Comments)  . Sulfa Antibiotics Rash  . Sulfacetamide Sodium Rash   VITALS:  Blood pressure (!) 152/60, pulse 72, temperature 98.6 F (37 C), temperature source Oral, resp. rate 16, height 5\' 1"  (1.549 m), weight 111 lb 1.8 oz (50.4 kg), SpO2 97 %. PHYSICAL EXAMINATION:  Physical Exam  Constitutional:  Severe malnutrition.  HENT:  Head: Normocephalic.  Eyes: Conjunctivae are normal. No scleral icterus.  Neck: Neck supple. No JVD present. No tracheal deviation present.  Cardiovascular: Normal rate, regular rhythm and normal heart sounds. Exam reveals no gallop.  No murmur heard. Pulmonary/Chest: Effort normal and breath sounds normal. No respiratory distress. She has no wheezes. She has no rales.  Abdominal: Soft. Bowel sounds are normal. She exhibits no distension. There is no tenderness. There is no rebound.  Musculoskeletal: She exhibits no edema or tenderness.  Neurological: No cranial nerve deficit.  Demented and noncommunicative, unable to exam.  Skin: No rash noted. No erythema.   LABORATORY PANEL:  Female CBC Recent Labs  Lab 03/29/18 0552  WBC 8.3  HGB 12.2  HCT 35.4  PLT 203   ------------------------------------------------------------------------------------------------------------------ Chemistries  Recent Labs  Lab 03/28/18 1350  03/30/18 0620  NA 142   < > 146*  K 3.9   < > 4.0  CL 109   < > 118*  CO2 24   < > 24  GLUCOSE 123*   < > 101*  BUN 27*   < > 19    CREATININE 1.56*   < > 1.07*  CALCIUM 9.3   < > 8.6*  MG  --   --  1.7  AST 23  --   --   ALT 13  --   --   ALKPHOS 65  --   --   BILITOT 0.9  --   --    < > = values in this interval not displayed.   RADIOLOGY:  No results found. ASSESSMENT AND PLAN:   Claire Williams  is a 84 y.o. female with a known history of worsening dementia, nonverbal at baseline, healing sacral decub ulcer and diet-controlled diabetes mellitus was brought in from home secondary to poor oral intake and altered mental status.  1. UTI On Rocephin, no gross per urine cultures  2.  Altered mental status-acute metabolic encephalopathy on top of dementia and also worsening dementia. -PCP recommended hospice Continue to treat the treatable with the goal of returning home soon with hospice outpatient per palliative care consult.  3.  Failure to thrive, severe malnutrition-secondary to poor intake from her dementia -Dietary supplements.  4.  Dementia and depression-continue home medications.  Patient on Namenda and Wellbutrin  Dehydration, improved with IV fluid support.  Hyponatremia.  Change to D5 IV and follow-up BMP. Hypomagnesemia.  IV magnesium.  The patient is bedbound per family.  All the records are reviewed and case discussed with Care Management/Social Worker. Management plans discussed with the patient, her husband, and they  are in agreement.  CODE STATUS: DNR  TOTAL TIME TAKING CARE OF THIS PATIENT: 26 minutes.   More than 50% of the time was spent in counseling/coordination of care: YES  POSSIBLE D/C IN 1-2 DAYS, DEPENDING ON CLINICAL CONDITION.   Shaune PollackQing Marolyn Urschel M.D on 03/30/2018 at 4:50 PM  Between 7am to 6pm - Pager - [phone]  After 6pm go to [email or web address] - Therapist, nutritionalpassword EPAS [facility]  [facility] [place] [facility]

## 2018-03-30 NOTE — Consult Note (Addendum)
Consultation Note Date: 03/30/2018   Patient Name: Claire Williams  DOB: 05-08-1934  MRN: [id number]  Age / Sex: 84 y.o., female  PCP: Lianne Moris, MD Referring Physician: Demetrios Loll, MD  Reason for Consultation: Establishing goals of care  HPI/Patient Profile: 84 y.o. female admitted on 03/28/2018 from home with poor po intake x 2 weeks and altered mental status. Patient has a significant medical history of end-stage dementia, nonverbal at baseline, diabetes, and healing sacral decub ulcer. During ED course patient was observed alert yet nonverbal. Medical history was obtained from husband and daughter who was at the bedside. Family reports over the last 2 weeks her intake has been extremely poor only taking a few small bites. She denies any pain, family denies any fevers, nausea or vomiting.  They called her PCPs office about a week ago and were told that this could be her dementia worsening versus an underlying infection.  Recommended to come to the office for urine testing.  They could not make that appointment and it was hard to get the patient out of her bed to even her wheelchair.  They have some aides coming to help during the day. Since admission she has been started on fluids and Rocephin for a UTI and seen by Speech. Palliative Medicine consulted for goals of care discussion.   Clinical Assessment and Goals of Care: I have reviewed medical records including lab results, imaging, Epic notes, and MAR, received report from the bedside RN, and assessed the patient. I then met at the bedside with her husband, Joneen Caraway to discuss diagnosis prognosis, [place], EOL wishes, disposition and options. Offered to arrange meeting were daughter could be present however, due to her work schedule she stated she works 7-5 each day and could not be present. Daughter will call with any questions once the husband calls her with and  update. Patient is nonverbal with end-stage dementia. She is unable to participate in goals discussion.   I introduced Palliative Medicine as specialized medical care for people living with serious illness. It focuses on providing relief from the symptoms and stress of a serious illness. The goal is to improve quality of life for both the patient and the family.  We discussed a brief life review of the patient. Patient and husband has been married for over 94 years. According to the husband they attended [facility] [facility] [employer] together. They have 2 daughters. One lives here an the other in [place], [place]. They also have 2 grandchildren. 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 They loved to travel and go to the beach. They have traveled around the entire [place] twice. They are of Christian faith.   As far as functional and nutritional status husband reports Claire Williams was diagnosed with dementia 10 years ago after family had noticed she was becoming more forgetful. He states her mother passed away with Alzheimer's so this concerned them. Unfortunately he has noticed a significant change in her condition over the past year with a more severe change over the past 2-3  months. He states she began to fall more and have not walked more than 5 steps alone in over a month. Her appetite has decreased, and 2 weeks prior to admission she would hardly take in any food other than a few bites of applesauce or water. He reports she was 154 lb and is now 111lbs. She has stopped holding conversations and every so often will respond with simple one word answers. He has a home health aid that comes in to the home 5 days a week for about 5 hours to assist with her ADLs now that she is more dependent on care. He states with her not walking he has to now prepare himself for her changes.   We discussed her current illness and what it means in the larger context of her on-going co-morbidities.  Natural disease trajectory and expectations at EOL  were discussed. Husband verbalizes he recognizes the drastic changes and knows that her body is shutting down. He is tearful during conversation and holds her hand.  I attempted to elicit values and goals of care important to the patient.    The difference between aggressive medical intervention and comfort care was considered in light of the patient's goals of care. He states he knows the end is closer than he would like, however he doesn't want to be selfish and keep her here on earth in her condition. He knows she would not want this. He does not want any forms of aggressive measures however, he would like to continue to treat the treatable.   Advanced directives, concepts specific to code status, artifical feeding and hydration, and rehospitalization were considered and discussed. Husband states he would like patient to be DNR/DNI and his daughter is aware of this even prior to her being hospitalized. She would not want to be placed on life support or receive any forms of life sustaining measures such as a PEG tube. I discussed in detail with the husband that patient would die of a natural death if she was to stop breathing or her heart was to stop. He was tearful, and verbalized his awareness and stated "she is ready and so am I when God calls [place] home!" He remained tearful.   Hospice and Palliative Care services outpatient were explained and offered. Husband verbalizes he would like her to return home with outpatient hospice services. He states it is important to him and his family to be in her own environment. We discussed the amount of care she would need and he is comfortable with home health assistance and having hospice as an additional resource. He states "I can care for her just as good and I want her home with me." I explained to him about residential hospice facility and the differences from their facility and a regular SNF. He verbalized understanding and is aware that if care becomes to much  on him in the home he can request hospice home, but at this time he would like assistance with equipment so he can take her home to be comfortable. Support was given.   Questions and concerns were addressed.  Hard Choices booklet left for review. The family was encouraged to call with questions or concerns.  PMT will continue to support holistically.  Primary Decision Maker: HCPOA-Russell Leipold (husband)    SUMMARY OF RECOMMENDATIONS    DNR/DNI-at husband's request  Continue to treat the treatable with the goal of returning home soon with hospice outpatient. No aggressive measures while hospitalized. Discussed with husband patient  is a candidate for hospice home with 24/7 care. However, he wishes for her to return to their home and receive EOL care.   Case Management consult for outpatient hospice/equipment needs.   Chaplain consult for emotional/spiritual support for husband.   Palliative Medicine team will continue to support patient, family, and medical team during hospitalization.   Code Status/Advance Care Planning:  DNR/DNI   Palliative Prophylaxis:   Aspiration, Bowel Regimen, Frequent Pain Assessment, Oral Care, Palliative Wound Care and Turn Reposition  Additional Recommendations (Limitations, Scope, Preferences):  Full Scope Treatment-continue to treat the treatable without aggressive measures.   Psycho-social/Spiritual:   Desire for further Chaplaincy support: Yes   Prognosis:   Poor prognosis (likely weeks) in the setting of end-stage dementia, decrease cognition, poor po intake, malnutrition, immobility, total care, bed rest, diabetes, and weight loss >10% 154 lb to 111 lb currently.   Discharge Planning: Home with Hospice.      Primary Diagnoses: Present on Admission: . UTI (urinary tract infection)   I have reviewed the medical record, interviewed the patient and family, and examined the patient. The following aspects are pertinent.  Past Medical  History:  Diagnosis Date  . Arthritis   . Dementia   . Diabetes mellitus without complication ([place])   . Sacral decubitus ulcer    Social History   Socioeconomic History  . Marital status: Unknown    Spouse name: Not on file  . Number of children: Not on file  . Years of education: Not on file  . Highest education level: Not on file  Occupational History  . Not on file  Social Needs  . Financial resource strain: Not on file  . Food insecurity:    Worry: Not on file    Inability: Not on file  . Transportation needs:    Medical: Not on file    Non-medical: Not on file  Tobacco Use  . Smoking status: Former Research scientist (life sciences)  . Smokeless tobacco: Never Used  Substance and Sexual Activity  . Alcohol use: No  . Drug use: No  . Sexual activity: Not on file  Lifestyle  . Physical activity:    Days per week: Not on file    Minutes per session: Not on file  . Stress: Not on file  Relationships  . Social connections:    Talks on phone: Not on file    Gets together: Not on file    Attends religious service: Not on file    Active member of club or organization: Not on file    Attends meetings of clubs or organizations: Not on file    Relationship status: Not on file  Other Topics Concern  . Not on file  Social History Narrative  . Not on file   Family History  Family history unknown: Yes   Scheduled Meds: . buPROPion  300 mg Oral Daily  . docusate sodium  100 mg Oral BID  . enoxaparin (LOVENOX) injection  30 mg Subcutaneous Q24H  . memantine  10 mg Oral Daily  . multivitamin with minerals  1 tablet Oral Daily  . protein supplement shake  11 oz Oral BID BM  . simvastatin  40 mg Oral QHS   Continuous Infusions: . cefTRIAXone (ROCEPHIN)  IV Stopped (03/30/18 1315)  . dextrose 75 mL/hr at 03/30/18 0843   PRN Meds:.acetaminophen, nystatin, ondansetron **OR** ondansetron (ZOFRAN) IV, polyethylene glycol Medications Prior to Admission:  Prior to Admission medications     Medication Sig Start Date End Date  Taking? Authorizing Provider  buPROPion (WELLBUTRIN XL) 300 MG 24 hr tablet Take 300 mg by mouth daily.  02/17/15  Yes [provider]  memantine (NAMENDA) 10 MG tablet Take 10 mg by mouth daily.  02/16/14  Yes [provider]  simvastatin (ZOCOR) 40 MG tablet Take 40 mg by mouth at bedtime.  09/05/14 03/28/18 Yes [provider]  acetaminophen (TYLENOL) 325 MG tablet Take 650 mg by mouth every 6 (six) hours as needed.     [provider]  Blood Glucose Monitoring Suppl (BAYER CONTOUR MONITOR) W/DEVICE KIT by Other route once. for 1 dose Use to check blood sugars. ICD-9 250.00 12/13/14   [provider]  ciclopirox (PENLAC) 8 % solution Apply topically at bedtime. Apply to nail/surrounding skin and daily over previous coat. Every 7 days remove with alcohol and continue. Patient not taking: Reported on 03/28/2018 05/08/15   Trula Slade, DPM  nystatin (MYCOSTATIN) powder Apply 1 g topically 2 (two) times daily as needed.  12/10/12   [provider]   Allergies  Allergen Reactions  . Influenza Vaccines     Egg allergy  . Eggs Or Egg-Derived Products Other (See Comments)  . Sulfa Antibiotics Rash  . Sulfacetamide Sodium Rash   Review of Systems  Unable to perform ROS: Dementia    Physical Exam  Constitutional: Vital signs are normal.  Thin and fragile in appearance, non verbal  Cardiovascular: Normal rate, regular rhythm, normal heart sounds, intact distal pulses and normal pulses.  Pulmonary/Chest: Effort normal. She has decreased breath sounds.  Abdominal: Normal appearance.  Musculoskeletal:  Generalized weakness   Neurological: She is alert. She displays atrophy.  Dementia   Skin: Skin is warm and dry.  Psychiatric: Cognition and memory are impaired. She expresses inappropriate judgment.  Nursing note and vitals reviewed.   Vital Signs: BP (!) 152/60   Pulse 72   Temp 98.6 F (37 C) (Oral)    Resp 16   Ht '5\' 1"'$  (1.549 m)   Wt 50.4 kg (111 lb 1.8 oz)   SpO2 97%   BMI 20.99 kg/m  Pain Scale: PAINAD   Pain Score: 0-No pain   SpO2: SpO2: 97 % O2 Device:SpO2: 97 % O2 Flow Rate: .   IO: Intake/output summary:   Intake/Output Summary (Last 24 hours) at 03/30/2018 1620 Last data filed at 03/30/2018 1430 Gross per 24 hour  Intake 1440.5 ml  Output -  Net 1440.5 ml    LBM: Last BM Date: 04/27/18 Baseline Weight: Weight: 59 kg (130 lb) Most recent weight: Weight: 50.4 kg (111 lb 1.8 oz)     Palliative Assessment/Data:PPS 20 %   Time In: 1430 Time Out: 1545 Time Total: 75 min  Greater than 50%  of this time was spent counseling and coordinating care related to the above assessment and plan.  Signed by: Alda Lea, NP-BC Palliative Medicine Team  Phone: [phone] Fax: [phone] Pager: [phone] Amion: Bjorn Pippin    Please contact Palliative Medicine Team phone at [phone] for questions and concerns.  For individual provider: See Shea Evans

## 2018-03-30 NOTE — Progress Notes (Signed)
   03/30/18 1800  Clinical Encounter Type  Visited With Other (Comment) (patient sleeping, no family present)  Visit Type Psychological support;Spiritual support   Chaplain received Spiritual Care consult request to visit with the patient's husband and offer emotional/spiritual support.  Nurse on unit says patient's husband plans to return in the morning.  Will leave this consult open and report to incoming chaplain tomorrow morning so visit with patient's husband can be arranged.

## 2018-03-30 NOTE — Evaluation (Signed)
Clinical/Bedside Swallow Evaluation Patient Details  Name: Claire Williams MRN: [id number] Date of Birth: 09-22-1934  Today's Date: 03/30/2018 Time: SLP Start Time (ACUTE ONLY): 0800 SLP Stop Time (ACUTE ONLY): 0900 SLP Time Calculation (min) (ACUTE ONLY): 60 min  Past Medical History:  Past Medical History:  Diagnosis Date  . Arthritis   . Dementia   . Diabetes mellitus without complication ([facility])   . Sacral decubitus ulcer    Past Surgical History: History reviewed. No pertinent surgical history. HPI:  Pt is a 84 y.o. female with a known history of worsening Dementia, nonverbal at baseline, healing sacral decub ulcer and diet-controlled diabetes mellitus was brought in from home secondary to poor oral intake and altered mental status.  Over the last 2 weeks her intake has been extremely poor.  She is only having bites of applesauce.  She denies any pain, family denies any fevers, nausea or vomiting.  They called her PCPs office about a week ago and were told that this could be her dementia worsening versus an underlying infection. Pt has been admitted for a UTI. Pt stated he has been caring for her w/ Dementia for a "long time". Pt is nonverbal and does not follow instruction; requires verbal/tactile cues to follow through w/ tasks.    Assessment / Plan / Recommendation Clinical Impression  Pt appears to present w/ moderately+ declined Cognitive status significantly impacting her overall awareness and orientation to the task of oral intake - this can certainly impact her overall oral intake and increase oropharyngeal phase swallowing issues which can increase risk for aspiration. Pt appeared to tolerate trials of thin liquids and purees fed to her w/ no immediate, overt s/s of aspiration noted; no decline in respiratory status occurred during/post trials. (Unable to assess vocal quality post trials d/t nonverbal status.) Oral phase was c/b min+ prolonged oral phase timing as she dealt w/ bolus  trials given; min increased time for bolus management, A-P transfer, and oral clearing w/ purees. The thin liquids appeared to clear more timely. Pt required mod+ tactile cues for oral awareness to accept boluses, use straw for sucking in the liquids to drink, and attending to the food and oral clearing. Pt was nonverbal and did not follow commands. Oral intake during the assessment was adequate w/ the consistencies provided - pt would benefit from a more modified diet of Puree consistency foods d/t her declined Cognitive status at baseline. Recommend a dysphagia level 1(puree) w/ thin liquids; aspiration precautions; Pills given in Puree - Crushed as able. NSG and husband updated, agreed.  SLP Visit Diagnosis: Dysphagia, oropharyngeal phase (R13.12)    Aspiration Risk  Mild aspiration risk(d/t declined Cognitive status)    Diet Recommendation  Dysphagia level 1 (puree) w/ Thin liquids; general aspiration precautions. Feeding support w/ cues and checking for oral clearing during meals  Medication Administration: Crushed with puree(as able or in liquid form mixed in puree)    Other  Recommendations Recommended Consults: (dietician f/u;  PALLIATIVE CARE CONSULT) Oral Care Recommendations: Oral care BID;Staff/trained caregiver to provide oral care Other Recommendations: (n/a)   Follow up Recommendations None      Frequency and Duration min 2x/week  1 week       Prognosis Prognosis for Safe Diet Advancement: Fair Barriers to Reach Goals: Cognitive deficits;Severity of deficits;Time post onset;Language deficits      Swallow Study   General Date of Onset: 04/27/18 HPI: Pt is a 84 y.o. female with a known history of worsening Dementia, nonverbal at  baseline, healing sacral decub ulcer and diet-controlled diabetes mellitus was brought in from home secondary to poor oral intake and altered mental status.  Over the last 2 weeks her intake has been extremely poor.  She is only having bites of  applesauce.  She denies any pain, family denies any fevers, nausea or vomiting.  They called her PCPs office about a week ago and were told that this could be her dementia worsening versus an underlying infection. Pt has been admitted for a UTI. Pt stated he has been caring for her w/ Dementia for a "long time". Pt is nonverbal and does not follow instruction; requires verbal/tactile cues to follow through w/ tasks.  Type of Study: Bedside Swallow Evaluation Previous Swallow Assessment: none reported Diet Prior to this Study: Dysphagia 2 (chopped);Thin liquids Temperature Spikes Noted: No(wbc 8.3) Respiratory Status: Room air History of Recent Intubation: No Behavior/Cognition: Pleasant mood;Confused;Cooperative;Requires cueing;Doesn't follow directions(awake; nonverbal ) Oral Cavity Assessment: (adequate but limited assessment d/t cognition) Oral Care Completed by SLP: Recent completion by staff Oral Cavity - Dentition: Adequate natural dentition Vision: (n/a) Self-Feeding Abilities: Total assist Patient Positioning: Upright in bed Baseline Vocal Quality: (nonverbal) Volitional Cough: Cognitively unable to elicit Volitional Swallow: Unable to elicit    Oral/Motor/Sensory Function Overall Oral Motor/Sensory Function: (appeared wfl for bolus manipulation and clearing)   Ice Chips Ice chips: Within functional limits Presentation: Spoon(2 trials) Other Comments: reduced oral movements - min   Thin Liquid Thin Liquid: Impaired Presentation: Straw(fed; 10 trials total) Oral Phase Impairments: Poor awareness of bolus(required                 ) Oral Phase Functional Implications: Prolonged oral transit(min) Pharyngeal  Phase Impairments: (none)    Nectar Thick Nectar Thick Liquid: Not tested   Honey Thick Honey Thick Liquid: Not tested   Puree Puree: Impaired Presentation: Spoon(fed; 10+ trials) Oral Phase Impairments: Poor awareness of bolus Oral Phase Functional Implications: Prolonged  oral transit Pharyngeal Phase Impairments: (none)   Solid   GO   Solid: Not tested Other Comments: declined Cognitive status         Jerilynn Som, MS, CCC-SLP Watson,Katherine 03/30/2018,11:32 AM

## 2018-03-31 DIAGNOSIS — Z66 Do not resuscitate: Secondary | ICD-10-CM

## 2018-03-31 LAB — BASIC METABOLIC PANEL
ANION GAP: 6 (ref 5–15)
BUN: 18 mg/dL (ref 8–23)
CO2: 24 mmol/L (ref 22–32)
Calcium: 8 mg/dL — ABNORMAL LOW (ref 8.9–10.3)
Chloride: 107 mmol/L (ref 98–111)
Creatinine, Ser: 0.9 mg/dL (ref 0.44–1.00)
GFR calc Af Amer: >60 mL/min (ref >60)
GFR calc non Af Amer: 58 mL/min — ABNORMAL LOW (ref >60)
Glucose, Bld: 148 mg/dL — ABNORMAL HIGH (ref 70–99)
POTASSIUM: 3.8 mmol/L (ref 3.5–5.1)
SODIUM: 137 mmol/L (ref 135–145)

## 2018-03-31 LAB — MAGNESIUM: MAGNESIUM: 1.9 mg/dL (ref 1.7–2.4)

## 2018-03-31 NOTE — Discharge Summary (Signed)
Claire Williams at [employer] NAME: Claire Williams    MR#:  [id number]  DATE OF BIRTH:  11-11-1933  DATE OF ADMISSION:  03/28/2018   ADMITTING PHYSICIAN: Gladstone Lighter, MD  DATE OF DISCHARGE:  03/31/2018 PRIMARY CARE PHYSICIAN: Lianne Moris, MD   ADMISSION DIAGNOSIS:  Urinary tract infection without hematuria, site unspecified [N39.0] Altered mental status, unspecified altered mental status type [R41.82] DISCHARGE DIAGNOSIS:  Active Problems:   UTI (urinary tract infection)  SECONDARY DIAGNOSIS:   Past Medical History:  Diagnosis Date  . Arthritis   . Dementia   . Diabetes mellitus without complication ([place])   . Sacral decubitus ulcer    HOSPITAL COURSE:  NancyKoelschis 84 y.o.femalewith a known history of worsening dementia, nonverbal at baseline, healing sacral decub ulcer and diet-controlled diabetes mellitus was brought in from home secondary to poor oral intake and altered mental status.  1. UTI She is treated with Rocephin, no gross per urine cultures. Discontinue abx.  2. Altered mental status-acute metabolic encephalopathy on top of dementia and also worsening dementia. -PCP recommended hospice Continue to treat the treatable with the goal of returning home soon with hospice outpatient per palliative care consult.  3. Failure to thrive, severe malnutrition-secondary to poor intake from her dementia -Dietary supplements.  4. Dementia and depression-continue home medications. Patient on Namenda and Wellbutrin  Dehydration, improved with IV fluid support.  Hyponatremia.  improved with D5 IV. Hypomagnesemia.  IV magnesium and improved.  The patient is bedbound per family. DISCHARGE CONDITIONS:  Stable, discharge to home with hospice care. CONSULTS OBTAINED:   DRUG ALLERGIES:   Allergies  Allergen Reactions  . Influenza Vaccines     Egg allergy  . Eggs Or Egg-Derived Products Other (See Comments)    . Sulfa Antibiotics Rash  . Sulfacetamide Sodium Rash   DISCHARGE MEDICATIONS:   Allergies as of 03/31/2018      Reactions   Influenza Vaccines    Egg allergy   Eggs Or Egg-derived Products Other (See Comments)   Sulfa Antibiotics Rash   Sulfacetamide Sodium Rash      Medication List    TAKE these medications   acetaminophen 325 MG tablet Commonly known as:  TYLENOL Take 650 mg by mouth every 6 (six) hours as needed.   BAYER CONTOUR MONITOR w/Device Kit by Other route once. for 1 dose Use to check blood sugars. ICD-9 250.00   ciclopirox 8 % solution Commonly known as:  PENLAC Apply topically at bedtime. Apply to nail/surrounding skin and daily over previous coat. Every 7 days remove with alcohol and continue.   NAMENDA 10 MG tablet Generic drug:  memantine Take 10 mg by mouth daily.   nystatin powder Commonly known as:  MYCOSTATIN/NYSTOP Apply 1 g topically 2 (two) times daily as needed.   simvastatin 40 MG tablet Commonly known as:  ZOCOR Take 40 mg by mouth at bedtime.   WELLBUTRIN XL 300 MG 24 hr tablet Generic drug:  buPROPion Take 300 mg by mouth daily.        DISCHARGE INSTRUCTIONS:  See AVS. If you experience worsening of your admission symptoms, develop shortness of breath, life threatening emergency, suicidal or homicidal thoughts you must seek medical attention immediately by calling 911 or calling your MD immediately  if symptoms less severe.  You Must read complete instructions/literature along with all the possible adverse reactions/side effects for all the Medicines you take and that have been prescribed to you. Take any  new Medicines after you have completely understood and accpet all the possible adverse reactions/side effects.   Please note  You were cared for by a hospitalist during your hospital stay. If you have any questions about your discharge medications or the care you received while you were in the hospital after you are discharged,  you can call the unit and asked to speak with the hospitalist on call if the hospitalist that took care of you is not available. Once you are discharged, your primary care physician will handle any further medical issues. Please note that NO REFILLS for any discharge medications will be authorized once you are discharged, as it is imperative that you return to your primary care physician (or establish a relationship with a primary care physician if you do not have one) for your aftercare needs so that they can reassess your need for medications and monitor your lab values.    On the day of Discharge:  VITAL SIGNS:  Blood pressure (!) 151/70, pulse 68, temperature (!) 97.2 F (36.2 C), temperature source Axillary, resp. rate 18, height _0  (1.549 m), weight 111 lb 1.8 oz (50.4 kg), SpO2 100 %. PHYSICAL EXAMINATION:  GENERAL:  84 y.o.-year-old patient lying in the bed with no acute distress.  EYES: No scleral icterus. Extraocular muscles intact.  HEENT: Head atraumatic, normocephalic. Marland Kitchen  NECK:  Supple, no jugular venous distention. No thyroid enlargement, no tenderness.  LUNGS: Normal breath sounds bilaterally, no wheezing, rales,rhonchi or crepitation. No use of accessory muscles of respiration.  CARDIOVASCULAR: S1, S2 normal. No murmurs, rubs, or gallops.  ABDOMEN: Soft, non-tender, non-distended. Bowel sounds present. No organomegaly or mass.  EXTREMITIES: No pedal edema, cyanosis, or clubbing.  NEUROLOGIC: unable to exam. PSYCHIATRIC: The patient is demented. SKIN: No obvious rash, lesion, or ulcer.  DATA REVIEW:   CBC Recent Labs  Lab 03/29/18 0552  WBC 8.3  HGB 12.2  HCT 35.4  PLT 203    Chemistries  Recent Labs  Lab 03/28/18 1350  03/31/18 0503  NA 142   < > 137  K 3.9   < > 3.8  CL 109   < > 107  CO2 24   < > 24  GLUCOSE 123*   < > 148*  BUN 27*   < > 18  CREATININE 1.56*   < > 0.90  CALCIUM 9.3   < > 8.0*  MG  --    < > 1.9  AST 23  --   --   ALT 13  --   --    ALKPHOS 65  --   --   BILITOT 0.9  --   --    < > = values in this interval not displayed.     Microbiology Results  Results for orders placed or performed during the hospital encounter of 03/28/18  Urine Culture     Status: None   Collection Time: 03/28/18  1:50 PM  Result Value Ref Range Status   Specimen Description   Final    URINE, RANDOM Performed at [facility], [address], [place], [place] [address]    Special Requests   Final    NONE Performed at [facility], [address], [place], [place] [address]    Culture   Final    NO GROWTH Performed at [facility], [place] [address] [place], [place] [address]    Report Status 03/29/2018 FINAL  Final    RADIOLOGY:  No results  found.   Management plans discussed with the patient, family and they are in agreement.  CODE STATUS: DNR   TOTAL TIME TAKING CARE OF THIS PATIENT: 32 minutes.    Demetrios Loll M.D on 03/31/2018 at 12:09 PM  Between 7am to 6pm - Pager - [phone]  After 6pm go to [email or web address] - Proofreader  [facility]  Office  [phone]  CC: Primary care physician; Lianne Moris, MD   Note: This dictation was prepared with Dragon dictation along with smaller phrase technology. Any transcriptional errors that result from this process are unintentional.

## 2018-03-31 NOTE — Care Management Important Message (Signed)
Important Message  Patient Details  Name: Claire Williams MRN: [id number] Date of Birth: January 12, 1934   Medicare Important Message Given:  Yes    Olegario MessierKathy A Cesare Sumlin 03/31/2018, 9:45 AM

## 2018-03-31 NOTE — Progress Notes (Signed)
Chaplain met with Claire Williams, the patient's husband, to offer emotional and spiritual support. Claire Williams described cycling between good and bad days. His sadness and concern for the patient is apparent. Tears are right below the surface. Patient can't let go of his college sweetheart after 74 years of marriage, though he fully realizes that the patient is not thriving. Claire Williams described belonging to an active community of faith that continues to support him and the patient.  Also, he was clear about his belief in the [place] salvation of the patient and himself. Claire Williams was preoccupied with the upcoming tasks that are necessary for the patient's release to home Hospice care. Chaplain offered prayer and the patient and family.

## 2018-03-31 NOTE — Progress Notes (Signed)
Discharge instructions reviewed with husband.  Understanding was verbalized and all questions answered.  Patient discharged home via EMS in stable condition.

## 2018-03-31 NOTE — Progress Notes (Signed)
Daily Progress Note   Patient Name: Claire Williams       Date: 03/31/2018 DOB: [birth year]  Age: 84 y.o. MRN#: [id number] Attending Physician: Shaune Pollackhen, Qing, MD Primary Care Physician: Elvia CollumAleman, Marco A, MD Admit Date: 03/28/2018  Reason for Consultation/Follow-up: Establishing goals of care  Subjective: Patient lying in bed awake. Unresponsive. Husband at the bedside. He is tearful this morning. States his daughter and son-in-law is at the home arranging furniture to prepare for delivery of home equipment from hospice. He denies any questions or concerns. States he is prepared for hospice to come into the home and get everything set-up. He is also relieved to be going home and states he is thankful she will spend her last moments in the comfort of her home with friends and family. Support given. He has the [employer] Book available and very appreciative of it.   Chart Reviewed and report received from bedside RN.   Length of Stay: 3  Current Medications: Scheduled Meds:  . buPROPion  300 mg Oral Daily  . docusate  100 mg Oral BID  . enoxaparin (LOVENOX) injection  30 mg Subcutaneous Q24H  . memantine  10 mg Oral Daily  . multivitamin with minerals  1 tablet Oral Daily  . protein supplement shake  11 oz Oral BID BM  . simvastatin  40 mg Oral QHS    Continuous Infusions: . cefTRIAXone (ROCEPHIN)  IV 1 g (03/31/18 1328)    PRN Meds: acetaminophen, nystatin, ondansetron **OR** ondansetron (ZOFRAN) IV, polyethylene glycol  Physical Exam      Constitutional: Vital signs are normal.  Thin and fragile in appearance, non verbal  Cardiovascular: Normal rate, regular rhythm, normal heart sounds, intact distal pulses and normal pulses.  Pulmonary/Chest: Effort normal. She has decreased breath  sounds.  Abdominal: Normal appearance.  Musculoskeletal:  Generalized weakness   Neurological: She is alert. She displays atrophy.  Dementia   Skin: Skin is warm and dry.  Psychiatric: Cognition and memory are impaired. She expresses inappropriate judgment.  Nursing note and vitals reviewed.  Vital Signs: BP (!) 130/51   Pulse 77   Temp 99.3 F (37.4 C) (Oral)   Resp 16   Ht 5\' 1"  (1.549 m)   Wt 50.4 kg (111 lb 1.8 oz)   SpO2 98%   BMI 20.99 kg/m  SpO2: SpO2: 98 % O2 Device: O2 Device: Room Air O2 Flow Rate:    Intake/output summary:   Intake/Output Summary (Last 24 hours) at 03/31/2018 1431 Last data filed at 03/31/2018 1422 Gross per 24 hour  Intake 1768 ml  Output 900 ml  Net 868 ml   LBM: Last BM Date: 03/28/18 Baseline Weight: Weight: 59 kg (130 lb) Most recent weight: Weight: 50.4 kg (111 lb 1.8 oz)       Palliative Assessment/Data: PPS 20%   Patient Active Problem List   Diagnosis Date Noted  . UTI (urinary tract infection) 03/28/2018    Palliative Care Assessment & Plan   Patient Profile: 84 y.o. female admitted on 03/28/2018 from home with poor po intake x 2 weeks and altered mental status. Patient has a significant medical history of end-stage dementia, nonverbal at baseline, diabetes, and healing sacral decub ulcer. During ED course patient was observed alert yet nonverbal. Medical history was obtained from husband and daughter who was at the bedside. Family reports over the last 2 weeks her intake has been extremely poor only taking a few small bites. She denies any pain, family denies any fevers, nausea or vomiting. They called her PCPs office about a week ago and were told that this could be her dementia worsening versus an underlying infection. Recommended to come to the office for urine testing. They could not make that appointment and it was hard to get the patient out of her bed to even her wheelchair. They have some aides coming to help during the  day. Since admission she has been started on fluids and Rocephin for a UTI and seen by Speech. Palliative Medicine consulted for goals of care discussion   Recommendations/Plan:  DNR/DNI-at husband's request  Continue to treat the treatable with the goal of returning home soon with hospice outpatient. No aggressive measures while hospitalized. Discussed with husband patient is a candidate for hospice home with 24/7 care. However, he wishes for her to return to their home and receive EOL care.   Case Management and Clydie Braun, RN [facility]) arranging for outpatient hospice/equipment needs.   Chaplain consult for emotional/spiritual support for husband.   Palliative Medicine team will continue to support patient, family, and medical team during hospitalization  Goals of Care and Additional Recommendations:  Limitations on Scope of Treatment: Full Scope Treatment  Code Status:    Code Status Orders  (From admission, onward)        Start     Ordered   03/30/18 1440  Do not attempt resuscitation (DNR)  Continuous    Question Answer Comment  In the event of cardiac or respiratory ARREST Do not call a "code blue"   In the event of cardiac or respiratory ARREST Do not perform Intubation, CPR, defibrillation or ACLS   In the event of cardiac or respiratory ARREST Use medication by any route, position, wound care, and other measures to relive pain and suffering. May use oxygen, suction and manual treatment of airway obstruction as needed for comfort.      03/30/18 1439    Code Status History    Date Active Date Inactive Code Status Order ID Comments User Context   03/28/2018 1655 03/30/2018 1439 Full Code [id number]  Enid Baas, MD Inpatient    Advance Directive Documentation     Most Recent Value  Type of Advance Directive  Living will  Pre-existing out of facility DNR order (yellow form or pink MOST form)  -  "MOST" Form  in Place?  -       Prognosis:  Poor prognosis  (likely weeks) in the setting of end-stage dementia, decrease cognition, poor po intake, malnutrition, immobility, total care, bed rest, diabetes, and weight loss >10% 154 lb to 111 lb currently.  Discharge Planning:  Home with Hospice  Care plan was discussed with patient's husband, bedside RN, and Clydie Braun, RN [facility])   Thank you for allowing the Palliative Medicine Team to assist in the care of this patient.   Total Time 35 min.  Prolonged Time Billed  NO       Greater than 50%  of this time was spent counseling and coordinating care related to the above assessment and plan.  Willette Alma, NP  Please contact Palliative Medicine Team phone at [phone] for questions and concerns.

## 2018-03-31 NOTE — Care Management Note (Signed)
Case Management Note  Patient Details  Name: Claire Williams MRN: [id number] Date of Birth: 1934/07/04   Patient to discharge home today.  Patient lives at home with husband.  Hx dementia, admitted with UTI.  PCP [place].  Prior to admission only equipment in the home was a Rw and WC.  Patient is now bedbound, and incontinent.  Palliative care has met with family and was decided patient to discharge home with hospice services.  Agency preference was provided to husband.  He states that he does not have an agency preference.  Referral made to [facility] with [facility].  Patient will need hospital bed for discharge.  Patient will also likely need a hoyer lift.  Per Santiago Glad bed will be arranged prior to discharge, and when home assessment is completed lift will be ordered if indicated.  Signed DNR on chart.  Patient to transport via EMS after equipment is delivered.   Subjective/Objective:                    Action/Plan:   Expected Discharge Date:  03/31/18               Expected Discharge Plan:  [place]  In-House Referral:     Discharge planning Services  CM Consult  Post Acute Care Choice:  Home Health, Durable Medical Equipment Choice offered to:  Spouse  DME Arranged:  Hospital bed DME Agency:     HH Arranged:    [place]:  [facility]  Status of Service:  Completed, signed off  If discussed at [place] of Stay Meetings, dates discussed:    Additional Comments:  Beverly Sessions, RN 03/31/2018, 10:27 AM

## 2018-03-31 NOTE — Progress Notes (Signed)
New referral for [facility] services at home received from [place] following a Palliative Medicine consult. Patient is an 84 year old wman with a past medical history of dementia, arthritis and DM II admitted to [facility] on 6/29 for evaluation of decreased oral intake and altered mental status. She has received IV fluids, but has remained lethargic with very little oral intake. Palliative Medicine was consulted for goals of care and Palliative NP Ardelle Park met with patient's husband Claire Williams yesterday. He has chosen to have her return home with the support of hospice services. Writer met in the room with Claire Williams to intitiate education regarding hospice services, philosophy and team approach to care with good understanding voiced. Questions answered. Patient will require a hospital bed to be in place prior to discharge.EMS transport home once bed is in place. Patient information faxed to referral. Hospice information and contact numbers given to [place].  Hospital team updated.Signed DNR in place to accompany patient at discharge. Flo Shanks RN,BSN, [facility], hospital liaison [phone]

## 2018-03-31 NOTE — Discharge Instructions (Signed)
home with hospice care.

## 2018-05-11 ENCOUNTER — Inpatient Hospital Stay
Admission: EM | Admit: 2018-05-11 | Discharge: 2018-05-15 | DRG: 689 | Disposition: A | Discharge status: Hospice | Payer: Medicare Other | Attending: Internal Medicine | Admitting: Internal Medicine

## 2018-05-11 ENCOUNTER — Other Ambulatory Visit: Payer: Self-pay

## 2018-05-11 ENCOUNTER — Emergency Department: Payer: Medicare Other

## 2018-05-11 DIAGNOSIS — Z515 Encounter for palliative care: Secondary | ICD-10-CM | POA: Diagnosis not present

## 2018-05-11 DIAGNOSIS — J449 Chronic obstructive pulmonary disease, unspecified: Secondary | ICD-10-CM | POA: Diagnosis present

## 2018-05-11 DIAGNOSIS — E43 Unspecified severe protein-calorie malnutrition: Secondary | ICD-10-CM | POA: Diagnosis present

## 2018-05-11 DIAGNOSIS — E860 Dehydration: Secondary | ICD-10-CM

## 2018-05-11 DIAGNOSIS — Z91012 Allergy to eggs: Secondary | ICD-10-CM

## 2018-05-11 DIAGNOSIS — B952 Enterococcus as the cause of diseases classified elsewhere: Secondary | ICD-10-CM | POA: Diagnosis present

## 2018-05-11 DIAGNOSIS — Z87891 Personal history of nicotine dependence: Secondary | ICD-10-CM | POA: Diagnosis not present

## 2018-05-11 DIAGNOSIS — G9349 Other encephalopathy: Secondary | ICD-10-CM | POA: Diagnosis present

## 2018-05-11 DIAGNOSIS — M1990 Unspecified osteoarthritis, unspecified site: Secondary | ICD-10-CM | POA: Diagnosis present

## 2018-05-11 DIAGNOSIS — Z66 Do not resuscitate: Secondary | ICD-10-CM | POA: Diagnosis present

## 2018-05-11 DIAGNOSIS — F0280 Dementia in other diseases classified elsewhere without behavioral disturbance: Secondary | ICD-10-CM | POA: Diagnosis present

## 2018-05-11 DIAGNOSIS — L89153 Pressure ulcer of sacral region, stage 3: Secondary | ICD-10-CM | POA: Diagnosis present

## 2018-05-11 DIAGNOSIS — Z887 Allergy status to serum and vaccine status: Secondary | ICD-10-CM

## 2018-05-11 DIAGNOSIS — F0390 Unspecified dementia without behavioral disturbance: Secondary | ICD-10-CM | POA: Diagnosis present

## 2018-05-11 DIAGNOSIS — Z882 Allergy status to sulfonamides status: Secondary | ICD-10-CM | POA: Diagnosis not present

## 2018-05-11 DIAGNOSIS — R627 Adult failure to thrive: Secondary | ICD-10-CM | POA: Diagnosis present

## 2018-05-11 DIAGNOSIS — R400 Somnolence: Secondary | ICD-10-CM

## 2018-05-11 DIAGNOSIS — Z79899 Other long term (current) drug therapy: Secondary | ICD-10-CM

## 2018-05-11 DIAGNOSIS — N390 Urinary tract infection, site not specified: Secondary | ICD-10-CM | POA: Diagnosis present

## 2018-05-11 DIAGNOSIS — E119 Type 2 diabetes mellitus without complications: Secondary | ICD-10-CM | POA: Diagnosis present

## 2018-05-11 DIAGNOSIS — Z681 Body mass index (BMI) 19 or less, adult: Secondary | ICD-10-CM | POA: Diagnosis not present

## 2018-05-11 DIAGNOSIS — Z7189 Other specified counseling: Secondary | ICD-10-CM | POA: Diagnosis not present

## 2018-05-11 DIAGNOSIS — L8990 Pressure ulcer of unspecified site, unspecified stage: Secondary | ICD-10-CM

## 2018-05-11 LAB — URINALYSIS, COMPLETE (UACMP) WITH MICROSCOPIC
BILIRUBIN URINE: NEGATIVE
Glucose, UA: NEGATIVE mg/dL
KETONES UR: 5 mg/dL — AB
Nitrite: NEGATIVE
Specific Gravity, Urine: 1.015 (ref 1.005–1.030)
pH: 8 (ref 5.0–8.0)

## 2018-05-11 LAB — COMPREHENSIVE METABOLIC PANEL
ALT: 12 U/L (ref 0–44)
AST: 31 U/L (ref 15–41)
Albumin: 3.3 g/dL — ABNORMAL LOW (ref 3.5–5.0)
Alkaline Phosphatase: 73 U/L (ref 38–126)
Anion gap: 8 (ref 5–15)
BUN: 35 mg/dL — ABNORMAL HIGH (ref 8–23)
CHLORIDE: 112 mmol/L — AB (ref 98–111)
CO2: 24 mmol/L (ref 22–32)
Calcium: 9.5 mg/dL (ref 8.9–10.3)
Creatinine, Ser: 1.29 mg/dL — ABNORMAL HIGH (ref 0.44–1.00)
GFR calc Af Amer: 43 mL/min — ABNORMAL LOW (ref >60)
GFR, EST NON AFRICAN AMERICAN: 37 mL/min — AB (ref >60)
Glucose, Bld: 168 mg/dL — ABNORMAL HIGH (ref 70–99)
POTASSIUM: 5.1 mmol/L (ref 3.5–5.1)
Sodium: 144 mmol/L (ref 135–145)
Total Bilirubin: 1.5 mg/dL — ABNORMAL HIGH (ref 0.3–1.2)
Total Protein: 6.8 g/dL (ref 6.5–8.1)

## 2018-05-11 LAB — CBC
HEMATOCRIT: 38.8 % (ref 35.0–47.0)
HEMOGLOBIN: 13.4 g/dL (ref 12.0–16.0)
MCH: 31.3 pg (ref 26.0–34.0)
MCHC: 34.5 g/dL (ref 32.0–36.0)
MCV: 90.8 fL (ref 80.0–100.0)
Platelets: 316 10*3/uL (ref 150–440)
RBC: 4.28 MIL/uL (ref 3.80–5.20)
RDW: 13.1 % (ref 11.5–14.5)
WBC: 8.1 10*3/uL (ref 3.6–11.0)

## 2018-05-11 LAB — LIPASE, BLOOD: LIPASE: 51 U/L (ref 11–51)

## 2018-05-11 MED ORDER — SODIUM CHLORIDE 0.9 % IV SOLN: INTRAVENOUS | Status: DC

## 2018-05-11 MED ORDER — ONDANSETRON HCL 4 MG PO TABS: 4.0000 mg | ORAL_TABLET | Freq: Four times a day (QID) | ORAL | Status: DC | PRN

## 2018-05-11 MED ORDER — SODIUM CHLORIDE 0.9 % IV BOLUS
500.0000 mL | Freq: Once | INTRAVENOUS | Status: AC
  Administered 2018-05-11: 500 mL via INTRAVENOUS

## 2018-05-11 MED ORDER — SODIUM CHLORIDE 0.9 % IV SOLN
INTRAVENOUS | Status: DC
  Administered 2018-05-11 – 2018-05-12 (×2): via INTRAVENOUS

## 2018-05-11 MED ORDER — ENOXAPARIN SODIUM 30 MG/0.3ML ~~LOC~~ SOLN
30.0000 mg | SUBCUTANEOUS | Status: DC
  Administered 2018-05-11 – 2018-05-12 (×2): 30 mg via SUBCUTANEOUS
  Filled 2018-05-11 (×2): qty 0.3

## 2018-05-11 MED ORDER — ACETAMINOPHEN 325 MG PO TABS: 650.0000 mg | ORAL_TABLET | Freq: Four times a day (QID) | ORAL | Status: DC | PRN

## 2018-05-11 MED ORDER — NYSTATIN 100000 UNIT/GM EX POWD
1.0000 g | Freq: Two times a day (BID) | CUTANEOUS | Status: DC
  Administered 2018-05-11 – 2018-05-15 (×6): 1 g via TOPICAL
  Filled 2018-05-11: qty 15

## 2018-05-11 MED ORDER — SODIUM CHLORIDE 0.9 % IV SOLN
1.0000 g | INTRAVENOUS | Status: DC
  Administered 2018-05-12 – 2018-05-13 (×2): 1 g via INTRAVENOUS
  Filled 2018-05-11: qty 1
  Filled 2018-05-11: qty 10
  Filled 2018-05-11: qty 1

## 2018-05-11 MED ORDER — ONDANSETRON HCL 4 MG/2ML IJ SOLN: 4.0000 mg | Freq: Four times a day (QID) | INTRAMUSCULAR | Status: DC | PRN

## 2018-05-11 MED ORDER — CEFTRIAXONE SODIUM 1 G IJ SOLR
1.0000 g | Freq: Once | INTRAMUSCULAR | Status: AC
  Administered 2018-05-11: 1 g via INTRAVENOUS
  Filled 2018-05-11: qty 10

## 2018-05-11 MED ORDER — ACETAMINOPHEN 650 MG RE SUPP: 650.0000 mg | Freq: Four times a day (QID) | RECTAL | Status: DC | PRN

## 2018-05-11 MED ORDER — ENOXAPARIN SODIUM 40 MG/0.4ML ~~LOC~~ SOLN: 40.0000 mg | SUBCUTANEOUS | Status: DC

## 2018-05-11 NOTE — Progress Notes (Signed)
lovenox dose decreased to 30mg  daily due to crcl < 2630ml/min and a TBW of 45kg  Claire Williams D Saket Hellstrom, Pharm.D, BCPS Clinical Pharmacist

## 2018-05-11 NOTE — Progress Notes (Signed)
   [facility] [place] [facility]   Advance care planning  Hospital Day: 0 days Erskine Squibbancy Bloor is a 84 y.o. female presenting with Altered Mental Status  Patient presents with altered mental status secondary to UTI and also has advanced dementia with progressive decline.  Had a discussion with the patient's daughter and husband at bedside about considering hospice services given her recurrent admissions and declining health.  Patient's husband wants to pursue present care, will get palliative care consult to discuss goals of care with the husband and daughter and they are in agreement.  Advance care planning discussed with patient  with additional Family at bedside. All questions in regards to overall condition and expected prognosis answered. The decision was made to continue current code status  CODE STATUS: dnr Time spent: 16 minutes

## 2018-05-11 NOTE — Progress Notes (Signed)
Palliative Medicine consult noted. Due to high referral volume, there may be a delay seeing this patient. Please call the Palliative Medicine Team office at [phone] if recommendations are needed in the interim.  Thank you for inviting us to see this patient.  Margret ChanceMelanie G. Izabelle Daus, RN, BSN, [facility] Palliative Medicine Team 05/11/2018 3:30 PM Office [phone]

## 2018-05-11 NOTE — ED Triage Notes (Signed)
Pt arrives from home for AMS baseline is 1-2 word answer. Pt is not responding to husband. Daugther reports strong urine odor. 20 g L Forearm 173/83 155/71  Fluids given

## 2018-05-11 NOTE — ED Notes (Signed)
ED Provider at bedside. 

## 2018-05-11 NOTE — ED Provider Notes (Signed)
[facility] Emergency Department Provider Note ____________________________________________   First MD Initiated Contact with Patient 05/11/18 1357     (approximate)  I have reviewed the triage vital signs and the nursing notes.   HISTORY  Chief Complaint Altered Mental Status  EM caveat patient is nonverbal, responsive only to verbal stimuli but unable to provide any history.  Confusion.  HPI Claire Williams is a 84 y.o. female here for evaluation of confusion, decreased responsiveness  Patient family reports decreased responsiveness over the last day.  Also very foul urine odor.  Reports the same symptoms occurred about a month ago and she had to be hospitalized and treated for urinary tract infection  Patient does have dementia which is fairly severe, but is been much worse with regard to mental status last day    Past Medical History:  Diagnosis Date  . Arthritis   . Dementia   . Diabetes mellitus without complication (HCC)   . Sacral decubitus ulcer     Patient Active Problem List   Diagnosis Date Noted  . UTI (urinary tract infection) 03/28/2018    History reviewed. No pertinent surgical history.  Prior to Admission medications   Medication Sig Start Date End Date Taking? Authorizing Provider  ciclopirox (PENLAC) 8 % solution Apply topically at bedtime. Apply to nail/surrounding skin and daily over previous coat. Every 7 days remove with alcohol and continue. Patient not taking: Reported on 03/28/2018 05/08/15   Vivi BarrackWagoner, Matthew R, DPM  nystatin (MYCOSTATIN) powder Apply 1 g topically 2 (two) times daily as needed.  12/10/12   [provider]    Allergies Influenza vaccines; Eggs or egg-derived products; Sulfa antibiotics; and Sulfacetamide sodium  Family History  Family history unknown: Yes    Social History Social History   Tobacco Use  . Smoking status: Former Games developermoker  . Smokeless tobacco: Never Used  Substance Use  Topics  . Alcohol use: No  . Drug use: No    Review of Systems   EM caveat    ____________________________________________   PHYSICAL EXAM:  VITAL SIGNS: ED Triage Vitals  Enc Vitals Group     BP 05/11/18 1130 (!) 166/80     Pulse Rate 05/11/18 1123 90     Resp 05/11/18 1123 20     Temp 05/11/18 1123 98.2 F (36.8 C)     Temp Source 05/11/18 1123 Oral     SpO2 05/11/18 1123 97 %     Weight 05/11/18 1125 111 lb 1.8 oz (50.4 kg)     Height 05/11/18 1125 5\' 5"  (1.651 m)     Head Circumference --      Peak Flow --      Pain Score --      Pain Loc --      Pain Edu? --      Excl. in GC? --     Constitutional: Alert to voice.  Does not speak, does not follow commands.  Sitting pleasantly, does track with eyes and move hands purposefully but fatigued and generally weak in all extremities.  No focal abnormality noted Eyes: Conjunctivae are normal. Head: Atraumatic. Nose: No congestion/rhinnorhea. Mouth/Throat: Mucous membranes are dry Neck: No stridor.   Cardiovascular: Normal rate, regular rhythm. Grossly normal heart sounds.  Good peripheral circulation. Respiratory: Normal respiratory effort.  No retractions. Lungs CTAB. Gastrointestinal: Soft and nontender. No distention. Musculoskeletal: No lower extremity tenderness nor edema. Neurologic: Somewhat fatigued.  Nonverbal, but does track and uses arms. Skin:  Skin  is warm, dry and intact. No rash noted. Psychiatric: Mood and affect are calm, placid.  ____________________________________________   LABS (all labs ordered are listed, but only abnormal results are displayed)  Labs Reviewed  COMPREHENSIVE METABOLIC PANEL - Abnormal; Notable for the following components:      Result Value   Chloride 112 (*)    Glucose, Bld 168 (*)    BUN 35 (*)    Creatinine, Ser 1.29 (*)    Albumin 3.3 (*)    Total Bilirubin 1.5 (*)    GFR calc non Af Amer 37 (*)    GFR calc Af Amer 43 (*)    All other components within normal  limits  URINALYSIS, COMPLETE (UACMP) WITH MICROSCOPIC - Abnormal; Notable for the following components:   APPearance TURBID (*)    Hgb urine dipstick SMALL (*)    Ketones, ur 5 (*)    Protein, ur >=300 (*)    Leukocytes, UA MODERATE (*)    RBC / HPF >50 (*)    WBC, UA >50 (*)    Bacteria, UA MANY (*)    All other components within normal limits  URINE CULTURE  CBC  LIPASE, BLOOD   ____________________________________________  EKG   ____________________________________________  RADIOLOGY    CT head, negative for acute. ____________________________________________   PROCEDURES  Procedure(s) performed: None  Procedures  Critical Care performed: No  ____________________________________________   INITIAL IMPRESSION / ASSESSMENT AND PLAN / ED COURSE  Pertinent labs & imaging results that were available during my care of the patient were reviewed by me and considered in my medical decision making (see chart for details).  Decreased responsiveness, foul urine odor with similar presentation recently.  Clinical history and evaluation appear consistent with urinary tract infection with pyuria.  We will treat with Rocephin as patient was treated with this before and showed good response though no bacteria were grown.  Her urine certainly looks and smells as though urinary tract infection, associated encephalopathy.  Provide hydration.  Discussed with patient family including husband at the bedside, will admit for ongoing care and treatment to the hospitalist service.      ____________________________________________   FINAL CLINICAL IMPRESSION(S) / ED DIAGNOSES  Final diagnoses:  Urinary tract infection, acute  Somnolence  Dehydration      NEW MEDICATIONS STARTED DURING THIS VISIT:  New Prescriptions   No medications on file     Note:  This document was prepared using Dragon voice recognition software and may include unintentional dictation errors.       Sharyn CreamerQuale, Mark, MD 05/11/18 1419

## 2018-05-11 NOTE — H&P (Signed)
[employer] -  at [facility]   PATIENT NAME: Claire Williams    MR#:  [id number]  DATE OF BIRTH:  April 27, 1934  DATE OF ADMISSION:  05/11/2018  PRIMARY CARE PHYSICIAN: Elvia CollumAleman, Marco A, MD   REQUESTING/REFERRING PHYSICIAN: Dr. Sharyn CreamerMark Quale  CHIEF COMPLAINT:   Chief Complaint  Patient presents with  . Altered Mental Status    HISTORY OF PRESENT ILLNESS:  Claire Squibbancy Birr  is a 84 y.o. female with a known history of advanced dementia, osteoarthritis, adult failure to thrive who presents to the hospital from home due to altered mental status/lethargy.  Patient has advanced dementia and is usually total care but over the past weekend she has been more sleepy and lethargic, she is not arousable and therefore the husband was concerned and brought her to the ER.  She had a similar admission in June of this past year and was noted to have urinary tract infection and improved with IV antibiotics.  She presents to the ER and was noted to have a UTI based off a urinalysis.  Hospitalist services were contacted for admission.  Per the husband patient had no fever, but her urine did smell foul.    PAST MEDICAL HISTORY:   Past Medical History:  Diagnosis Date  . Arthritis   . Dementia   . Diabetes mellitus without complication (HCC)   . Sacral decubitus ulcer     PAST SURGICAL HISTORY:  History reviewed. No pertinent surgical history.  SOCIAL HISTORY:   Social History   Tobacco Use  . Smoking status: Former Games developermoker  . Smokeless tobacco: Never Used  Substance Use Topics  . Alcohol use: No    FAMILY HISTORY:   Family History  Family history unknown: Yes    DRUG ALLERGIES:   Allergies  Allergen Reactions  . Influenza Vaccines     Egg allergy  . Eggs Or Egg-Derived Products Other (See Comments)  . Sulfa Antibiotics Rash  . Sulfacetamide Sodium Rash    REVIEW OF SYSTEMS:   Review of Systems  Unable to perform ROS: Dementia    MEDICATIONS AT HOME:   Prior  to Admission medications   Medication Sig Start Date End Date Taking? Authorizing Provider  ciclopirox (PENLAC) 8 % solution Apply topically at bedtime. Apply to nail/surrounding skin and daily over previous coat. Every 7 days remove with alcohol and continue. Patient not taking: Reported on 03/28/2018 05/08/15   Vivi BarrackWagoner, Matthew R, DPM  nystatin (MYCOSTATIN) powder Apply 1 g topically 2 (two) times daily as needed.  12/10/12   [provider]      VITAL SIGNS:  Blood pressure (!) 173/94, pulse 78, temperature 98.2 F (36.8 C), temperature source Oral, resp. rate 12, height 5\' 5"  (1.651 m), weight 50.4 kg, SpO2 100 %.  PHYSICAL EXAMINATION:  Physical Exam  GENERAL:  84 y.o.-year-old patient lying in the bed lethargic/non-verbal and encephalopathic.   EYES: Pupils equal, round, reactive to light. No scleral icterus. Extraocular muscles intact.  HEENT: Head atraumatic, normocephalic. Oropharynx and nasopharynx clear. No oropharyngeal erythema, moist oral mucosa  NECK:  Supple, no jugular venous distention. No thyroid enlargement, no tenderness.  LUNGS: Normal breath sounds bilaterally, no wheezing, rales, rhonchi. No use of accessory muscles of respiration.  CARDIOVASCULAR: S1, S2 RRR. No murmurs, rubs, gallops, clicks.  ABDOMEN: Soft, nontender, nondistended. Bowel sounds present. No organomegaly or mass.  EXTREMITIES: No pedal edema, cyanosis, or clubbing. + 2 pedal & radial pulses b/l.   NEUROLOGIC: Cranial nerves II through  XII are intact. Globally weak & Encephalopathic.  PSYCHIATRIC: The patient is alert and oriented x 1.  SKIN: No obvious rash, lesion, Stage II Sacral decubitus Ulcer.    LABORATORY PANEL:   CBC Recent Labs  Lab 05/11/18 1128  WBC 8.1  HGB 13.4  HCT 38.8  PLT 316   ------------------------------------------------------------------------------------------------------------------  Chemistries  Recent Labs  Lab 05/11/18 1128  NA 144  K 5.1  CL 112*   CO2 24  GLUCOSE 168*  BUN 35*  CREATININE 1.29*  CALCIUM 9.5  AST 31  ALT 12  ALKPHOS 73  BILITOT 1.5*   ------------------------------------------------------------------------------------------------------------------  Cardiac Enzymes No results for input(s): TROPONINI in the last 168 hours. ------------------------------------------------------------------------------------------------------------------  RADIOLOGY:  Ct Head Wo Contrast  Result Date: 05/11/2018 CLINICAL DATA:  Altered mental status with decreased responsiveness EXAM: CT HEAD WITHOUT CONTRAST TECHNIQUE: Contiguous axial images were obtained from the base of the skull through the vertex without intravenous contrast. COMPARISON:  March 28, 2018 FINDINGS: Brain: Moderate diffuse atrophy is again noted, stable. There is no intracranial mass, hemorrhage, extra-axial fluid collection, or midline shift. There is patchy small vessel disease in the centra semiovale bilaterally, stable. No new gray-white compartment lesions evident. No acute infarct appreciable. Vascular: No hyperdense vessel. There is calcification in each distal vertebral artery as well as in each carotid siphon and proximal middle cerebral artery. Skull: Bony calvarium appears intact. Sinuses/Orbits: There is focal opacity in a posterior left ethmoid air cell. There is mucosal thickening in multiple ethmoid air cells as well. Other paranasal sinuses which are visualized are clear. Orbits appear symmetric bilaterally except for previous cataract removal on the left. Other: Mastoid air cells are clear. IMPRESSION: Stable atrophy with periventricular small vessel disease. No acute infarct evident. No mass or hemorrhage. There are foci of arterial vascular calcification. There is ethmoid sinus disease. Electronically Signed   By: Bretta BangWilliam  Woodruff III M.D.   On: 05/11/2018 12:50     IMPRESSION AND PLAN:   84 year old female with past medical history of advanced  dementia, osteoarthritis, adult failure to thrive who presents to the hospital due to altered mental status/lethargy.  1.  Altered mental status- multifactorial nature related to underlying advanced dementia combined with metabolic encephalopathy due to UTI. -Patient CT head was negative for acute pathology.  We will give the patient IV fluids, IV ceftriaxone for the UTI and follow mental status.  2.  Urinary tract infection-based off a urinalysis on admission. - Treat the patient with IV ceftriaxone, follow urine cultures.  3.  Advanced dementia/failure to thrive- patient is currently followed by home health services, will get palliative care consult to discuss goals of care.  Prognosis is guarded to poor.    All the records are reviewed and case discussed with ED provider. Management plans discussed with the patient, family and they are in agreement.  CODE STATUS: DNR  TOTAL TIME TAKING CARE OF THIS PATIENT: 45 minutes.    Houston SirenSAINANI,Carlyle Mcelrath J M.D on 05/11/2018 at 2:12 PM  Between 7am to 6pm - Pager - [phone]  After 6pm go to [email or web address] - password [email or web address]  [facility]  Office  [phone]  CC: Primary care physician; Elvia CollumAleman, Marco A, MD

## 2018-05-12 DIAGNOSIS — L8990 Pressure ulcer of unspecified site, unspecified stage: Secondary | ICD-10-CM

## 2018-05-12 DIAGNOSIS — E43 Unspecified severe protein-calorie malnutrition: Secondary | ICD-10-CM

## 2018-05-12 LAB — CBC
HCT: 38.9 % (ref 35.0–47.0)
Hemoglobin: 13.1 g/dL (ref 12.0–16.0)
MCH: 30.7 pg (ref 26.0–34.0)
MCHC: 33.7 g/dL (ref 32.0–36.0)
MCV: 91.2 fL (ref 80.0–100.0)
Platelets: 264 K/uL (ref 150–440)
RBC: 4.27 MIL/uL (ref 3.80–5.20)
RDW: 13.4 % (ref 11.5–14.5)
WBC: 9.1 K/uL (ref 3.6–11.0)

## 2018-05-12 LAB — BASIC METABOLIC PANEL WITH GFR
Anion gap: 6 (ref 5–15)
BUN: 29 mg/dL — ABNORMAL HIGH (ref 8–23)
CO2: 24 mmol/L (ref 22–32)
Calcium: 9 mg/dL (ref 8.9–10.3)
Chloride: 119 mmol/L — ABNORMAL HIGH (ref 98–111)
Creatinine, Ser: 1.08 mg/dL — ABNORMAL HIGH (ref 0.44–1.00)
GFR calc Af Amer: 54 mL/min — ABNORMAL LOW
GFR calc non Af Amer: 46 mL/min — ABNORMAL LOW
Glucose, Bld: 118 mg/dL — ABNORMAL HIGH (ref 70–99)
Potassium: 3.9 mmol/L (ref 3.5–5.1)
Sodium: 149 mmol/L — ABNORMAL HIGH (ref 135–145)

## 2018-05-12 LAB — GLUCOSE, CAPILLARY
GLUCOSE-CAPILLARY: 199 mg/dL — AB (ref 70–99)
Glucose-Capillary: 206 mg/dL — ABNORMAL HIGH (ref 70–99)
Glucose-Capillary: 154 mg/dL — ABNORMAL HIGH (ref 70–99)

## 2018-05-12 MED ORDER — SODIUM CHLORIDE 0.45 % IV SOLN
INTRAVENOUS | Status: DC
  Administered 2018-05-12 – 2018-05-15 (×5): via INTRAVENOUS

## 2018-05-12 MED ORDER — ENSURE ENLIVE PO LIQD
237.0000 mL | Freq: Two times a day (BID) | ORAL | Status: DC
  Administered 2018-05-14: 237 mL via ORAL

## 2018-05-12 MED ORDER — OCUVITE-LUTEIN PO CAPS
1.0000 | ORAL_CAPSULE | Freq: Every day | ORAL | Status: DC
  Filled 2018-05-12 (×3): qty 1

## 2018-05-12 MED ORDER — INSULIN ASPART 100 UNIT/ML ~~LOC~~ SOLN
0.0000 [IU] | Freq: Three times a day (TID) | SUBCUTANEOUS | Status: DC
  Administered 2018-05-12: 2 [IU] via SUBCUTANEOUS
  Administered 2018-05-14: 1 [IU] via SUBCUTANEOUS
  Administered 2018-05-14: 2 [IU] via SUBCUTANEOUS
  Filled 2018-05-12 (×3): qty 1

## 2018-05-12 NOTE — Progress Notes (Signed)
Initial Nutrition Assessment  DOCUMENTATION CODES:   Severe malnutrition in context of chronic illness  INTERVENTION:   Ensure Enlive po BID, each supplement provides 350 kcal and 20 grams of protein  Ocuvite daily for wound healing (provides zinc, vitamin A, vitamin C, Vitamin E, copper, and selenium)  NUTRITION DIAGNOSIS:   Severe Malnutrition related to chronic illness(dementia) as evidenced by 33 percent weight loss, mild fat depletion, moderate muscle depletion.  GOAL:   Patient will meet greater than or equal to 90% of their needs  MONITOR:   PO intake, Supplement acceptance, Labs, Weight trends, Skin, I & O's  REASON FOR ASSESSMENT:   Malnutrition Screening Tool    ASSESSMENT:   84 year old female with past medical history of advanced dementia, osteoarthritis, adult failure to thrive who presents to the hospital due to altered mental status/lethargy. Pt found to have UTI  Visited pt's room today. Unable to speak with pt as pt with dementia so history obtained from pt's husband at bedside. Pt's husband reports pt with poor appetite and oral intake at baseline. Pt mainly eats foods such as yogurt, peaches, cottage cheese, ice cream, and [employer] Frostys. Pt does drink chocolate Premier Protein at home about 1/2 a carton per day. Pt is bed bound at baseline and requires full assistance with meals. Per chart, pt has lost 49lbs(33%) in one year and 11lbs(10%) in 6 weeks; this is significant wt loss. Husband agrees that pt has lost a significant amount of weight "over the years". Pt's lunch tray sitting on side table today; pt ate 100% of an ice cream and a yogurt and about 25% of some whipped potatoes with gravy. Pt with Stage III pressure injury to scrum. Palliative care following to establish GOC. RD will add supplements and Ocuvite to help pt meet her estimated needs and encourage wound healing.    Medications reviewed and include: lovenox, insulin, nystatin, 0.45% NaCl  @100ml /hr, ceftriaxone   Labs reviewed: Na 149(H), BUN 29(H), creat 1.08(H)  NUTRITION - FOCUSED PHYSICAL EXAM:    Most Recent Value  Orbital Region  Mild depletion  Upper Arm Region  No depletion  Thoracic and Lumbar Region  No depletion  Buccal Region  Mild depletion  Temple Region  Mild depletion  Clavicle Bone Region  Mild depletion  Clavicle and Acromion Bone Region  Mild depletion  Scapular Bone Region  Mild depletion  Dorsal Hand  Moderate depletion  Patellar Region  Moderate depletion  Anterior Thigh Region  Moderate depletion  Posterior Calf Region  Moderate depletion  Edema (RD Assessment)  None  Hair  Reviewed  Eyes  Reviewed  Mouth  Reviewed  Skin  Reviewed  Nails  Reviewed     Diet Order:   Diet Order            DIET - DYS 1 Room service appropriate? Yes with Assist; Fluid consistency: Thin  Diet effective now             EDUCATION NEEDS:   No education needs have been identified at this time  Skin:  Skin Assessment: Reviewed RN Assessment(Stage III sacrum 1 cm x 0.2 cm x 0.3 cm , ecchymosis )  Last BM:  8/12  Height:   Ht Readings from Last 1 Encounters:  05/11/18 5\' 5"  (1.651 m)    Weight:   Wt Readings from Last 1 Encounters:  05/11/18 45.2 kg    Ideal Body Weight:  56.8 kg  BMI:  Body mass index is 16.57 kg/m.  Estimated Nutritional Needs:   Kcal:  1200-1400kcal/day   Protein:  68-77g/day   Fluid:  >1.2L/day   Betsey Holidayasey Damesha Lawler MS, RD, LDN Pager #- [phone] Office#- [phone] After Hours Pager: [phone]

## 2018-05-12 NOTE — Plan of Care (Signed)
  Problem: Education: Goal: Knowledge of General Education information will improve Description Including pain rating scale, medication(s)/side effects and non-pharmacologic comfort measures Outcome: Progressing   Problem: Health Behavior/Discharge Planning: Goal: Ability to manage health-related needs will improve Outcome: Progressing   Problem: Clinical Measurements: Goal: Ability to maintain clinical measurements within normal limits will improve Outcome: Progressing   Problem: Coping: Goal: Ability to adjust to condition or change in health will improve Outcome: Progressing   Problem: Fluid Volume: Goal: Ability to maintain a balanced intake and output will improve Outcome: Progressing   

## 2018-05-12 NOTE — Consult Note (Signed)
WOC Nurse wound consult note Reason for Consult: Stage 3 pressure injury to coccyx, present on admission Wound type:pressure injury Daughter states patient is bedbound and her husband cares for her with caregiver int he home 5 days/week Pressure Injury POA: Yes Measurement: 1 cm x 0.2 cm x 0.3 cm  Wound ZOX:WRUEbed:pale pink nongranulating Drainage (amount, consistency, odor) minimal serosanguinous Periwound:blanchable erythema Dressing procedure/placement/frequency:Cleanse sacral wound with soap and water and pat dry.  Sacral foam dressing.  Change every three days and PRN soilage.  No disposable briefs while in bed.  Will not follow at this time.  Please re-consult if needed.  Maple HudsonKaren Laquita Harlan RN BSN CWON Pager [phone]

## 2018-05-12 NOTE — Progress Notes (Signed)
PALLIATIVE NOTE:   Consult received for goals of care. I have contacted patient's husband Mr. Rosine AbeRussel [place]. I will await a return call from him with a time to meet and have a goals of care discussion. Patient has advanced dementia and unable to engage in discussion.   Detailed note and recommendations to follow after family meeting.   Thank you for your referral.   Willette AlmaNikki Pickenpack-Cousar, NP-BC Palliative Medicine Team  Phone: [phone] Fax: [phone] Pager: [phone] Amion: N. Cousar

## 2018-05-12 NOTE — Evaluation (Signed)
Clinical/Bedside Swallow Evaluation Patient Details  Name: Claire Williams MRN: [id number] Date of Birth: 1934/08/27  Today's Date: 05/12/2018 Time: SLP Start Time (ACUTE ONLY): 0835 SLP Stop Time (ACUTE ONLY): 0935 SLP Time Calculation (min) (ACUTE ONLY): 60 min  Past Medical History:  Past Medical History:  Diagnosis Date  . Arthritis   . Dementia   . Diabetes mellitus without complication ([place])   . Sacral decubitus ulcer    Past Surgical History: History reviewed. No pertinent surgical history. HPI:  Pt is a 84 y.o. female with a known history of Advanced Dementia, osteoarthritis, adult failure to thrive who presents to the hospital from home due to altered mental status/lethargy.  Patient has advanced dementia and is usually total care but over the past weekend she has been more sleepy and lethargic. Husband was concerned and brought her to the ER.  She had a similar admission in June of this past year and was noted to have urinary tract infection and improved with IV antibiotics.  She presents to the ER and was noted to have a UTI based off a urinalysis.  Daughter reported difficulty w/ oral intake and need for increased stimulation and encouragement to take/eat po's at home. Per Daughter, pt has been eating more of a pureed diet; denied any coughing or choking when drinking liquids.    Assessment / Plan / Recommendation Clinical Impression  Pt appears to present w/ moderate oral phase Dysphagia impacted by her declined Cognitive status(Advanced Dementia). This presentation can increase risk for aspiration as well as risk for inadequate nutrition/needs being met. Pt required mod-max cues (especially initially) to begin accepting po's from utensil/pinched straw. Once she was more accepting of the po trials, she took small bites/sips of thin liquids and purees w/ no overt s/s of aspiration noted; no decline in respiratory status during/post trials. Pt was nonverbal during this session. Oral  phase c/b slow acceptance of po boluses w/ min increased oral phase time w/ increased textured trials. However, overall, pt was able to transfer and clear po trials w/ lingual sweeping noted at the end(bilaterally). Of note, Daughter reported pocketing of foods when attempting to solids at home. Pt required full feeding assistance; verbal/tactile cues during feeding. Recommend a pureed diet w/ thin liquids; general aspiration precautions; Pills CRUSHED in puree for safer, easier swallowing; full feeding assistance; verbal/tactile cues during feeding. Pt appears close to/at her baseline as per Daughter's description at home. Further education given to Daughter re: precautions; foods/consistencies/options.  SLP Visit Diagnosis: Dysphagia, oropharyngeal phase (R13.12)    Aspiration Risk  Mild aspiration risk;Risk for inadequate nutrition/hydration(but reduced following precautions)    Diet Recommendation  Dysphagia level 1 (puree) and thin liquids; aspiration precautions; feeding assistance at all meals. Monitor pt's cues and oral clearing; give verbal/tactils cues to encourage oral intake.   Medication Administration: Crushed with puree(for safer, easier swallowing)    Other  Recommendations Recommended Consults: (Dietician; Palliative Care) Oral Care Recommendations: Oral care BID;Staff/trained caregiver to provide oral care Other Recommendations: (n/a)   Follow up Recommendations None      Frequency and Duration (n/a)  (n/a)       Prognosis Prognosis for Safe Diet Advancement: Fair Barriers to Reach Goals: Cognitive deficits;Severity of deficits;Time post onset      Swallow Study   General Date of Onset: 05/11/18 HPI: Pt is a 84 y.o. female with a known history of Advanced Dementia, osteoarthritis, adult failure to thrive who presents to the hospital from home due to altered mental  status/lethargy.  Patient has advanced dementia and is usually total care but over the past weekend she has  been more sleepy and lethargic. Husband was concerned and brought her to the ER.  She had a similar admission in June of this past year and was noted to have urinary tract infection and improved with IV antibiotics.  She presents to the ER and was noted to have a UTI based off a urinalysis.  Daughter reported difficulty w/ oral intake and need for increased stimulation and encouragement to take/eat po's at home. Per Daughter, pt has been eating more of a pureed diet; denied any coughing or choking when drinking liquids.  Type of Study: Bedside Swallow Evaluation Previous Swallow Assessment: none reported Diet Prior to this Study: NPO Temperature Spikes Noted: No(wbc 9.1) Respiratory Status: Room air History of Recent Intubation: No Behavior/Cognition: Pleasant mood;Confused;Requires cueing;Doesn't follow directions(awake; mod-max cues; slow to respond) Oral Cavity Assessment: Dry(min sticky - minimal assessment d/t cognitive status) Oral Care Completed by SLP: Recent completion by staff Oral Cavity - Dentition: (native dentition noted) Vision: (n/a) Self-Feeding Abilities: Total assist Patient Positioning: Upright in bed Baseline Vocal Quality: (nonverbal) Volitional Cough: Cognitively unable to elicit Volitional Swallow: Unable to elicit    Oral/Motor/Sensory Function Overall Oral Motor/Sensory Function: (appeared grossly wfl for movements, oral clearing)   Ice Chips Ice chips: Impaired Presentation: Spoon(3 trials) Oral Phase Impairments: Poor awareness of bolus Oral Phase Functional Implications: Prolonged oral transit Pharyngeal Phase Impairments: (none)   Thin Liquid Thin Liquid: Impaired Presentation: Straw(pinched straw initially(3 trials); 8 sips after) Oral Phase Impairments: Poor awareness of bolus(needed cues to accept) Oral Phase Functional Implications: (none) Pharyngeal  Phase Impairments: (none)    Nectar Thick Nectar Thick Liquid: Not tested   Honey Thick Honey Thick  Liquid: Not tested   Puree Puree: Impaired Presentation: Spoon(fed; 8 trials) Oral Phase Impairments: Poor awareness of bolus(needed cues to accept) Oral Phase Functional Implications: Prolonged oral transit(min increased w/ textured trials) Pharyngeal Phase Impairments: (none)   Solid     Solid: Not tested Other Comments: c/t Cognitive decline       Orinda Kenner, MS, CCC-SLP Watson,Katherine 05/12/2018,3:20 PM

## 2018-05-12 NOTE — Progress Notes (Signed)
[facility] [place] at [facility]   PATIENT NAME: Claire Williams    MR#:  [id number]  DATE OF BIRTH:  01-12-1934  SUBJECTIVE:  CHIEF COMPLAINT:   Chief Complaint  Patient presents with  . Altered Mental Status   The patient is demented, noncommunicative.  Per her daughter, the patient is bedbound. REVIEW OF SYSTEMS:  Review of Systems  Unable to perform ROS: Dementia    DRUG ALLERGIES:   Allergies  Allergen Reactions  . Influenza Vaccines     Egg allergy  . Eggs Or Egg-Derived Products Other (See Comments)  . Sulfa Antibiotics Rash  . Sulfacetamide Sodium Rash   VITALS:  Blood pressure (!) 152/74, pulse 66, temperature 98.1 F (36.7 C), temperature source Oral, resp. rate 20, height 5\' 5"  (1.651 m), weight 45.2 kg, SpO2 99 %. PHYSICAL EXAMINATION:  Physical Exam  Constitutional:  Severe malnutrition.  HENT:  Head: Normocephalic.  Mouth/Throat: Oropharynx is clear and moist.  Eyes: Pupils are equal, round, and reactive to light. Conjunctivae and EOM are normal. No scleral icterus.  Neck: Neck supple. No JVD present. No tracheal deviation present.  Cardiovascular: Normal rate, regular rhythm and normal heart sounds. Exam reveals no gallop.  No murmur heard. Pulmonary/Chest: Effort normal and breath sounds normal. No respiratory distress. She has no wheezes. She has no rales.  Abdominal: Soft. Bowel sounds are normal. She exhibits no distension. There is no tenderness. There is no rebound.  Musculoskeletal: She exhibits no edema or tenderness.  Neurological:  Demented, unable to exam.  Skin: No rash noted. No erythema.  Sacral decub stage III.    LABORATORY PANEL:  Female CBC Recent Labs  Lab 05/12/18 0602  WBC 9.1  HGB 13.1  HCT 38.9  PLT 264   ------------------------------------------------------------------------------------------------------------------ Chemistries  Recent Labs  Lab 05/11/18 1128 05/12/18 0602  NA 144 149*  K  5.1 3.9  CL 112* 119*  CO2 24 24  GLUCOSE 168* 118*  BUN 35* 29*  CREATININE 1.29* 1.08*  CALCIUM 9.5 9.0  AST 31  --   ALT 12  --   ALKPHOS 73  --   BILITOT 1.5*  --    RADIOLOGY:  No results found. ASSESSMENT AND PLAN:   84 year old female with past medical history of advanced dementia, osteoarthritis, adult failure to thrive who presents to the hospital due to altered mental status/lethargy.  1.  Altered mental status- multifactorial nature related to underlying advanced dementia combined with metabolic encephalopathy due to UTI. -Patient CT head was negative for acute pathology.   Continue IV fluids, IV ceftriaxone for the UTI, follow-up urine culture. Aspiration precaution.  2.  Urinary tract infection-based off a urinalysis on admission. Continue IV ceftriaxone, follow urine cultures.  Patient cannot take p.o. Pills.  3.  Advanced dementia/failure to thrive- patient is currently followed by home health services, will get palliative care consult to discuss goals of care.  Prognosis is poor.  Dehydration.  IV fluid support and follow-up BMP. Severe malnutrition.  Follow dietitian's recommendation. Sacral decub stage III.  Wound care by nurse.  All the records are reviewed and case discussed with Care Management/Social Worker. Management plans discussed with the patient, her daughter and husband and they are in agreement.  CODE STATUS: DNR  TOTAL TIME TAKING CARE OF THIS PATIENT: 40 minutes.   More than 50% of the time was spent in counseling/coordination of care: YES  POSSIBLE D/C IN 2 DAYS, DEPENDING ON CLINICAL CONDITION.   Shaune PollackQing Kimberely Mccannon  M.D on 05/12/2018 at 3:48 PM  Between 7am to 6pm - Pager - [phone]  After 6pm go to [email or web address] - Therapist, nutritionalpassword EPAS [facility]  [facility] [place] [facility]

## 2018-05-13 LAB — BASIC METABOLIC PANEL
Anion gap: 6 (ref 5–15)
BUN: 22 mg/dL (ref 8–23)
CALCIUM: 9.1 mg/dL (ref 8.9–10.3)
CO2: 23 mmol/L (ref 22–32)
Chloride: 117 mmol/L — ABNORMAL HIGH (ref 98–111)
Creatinine, Ser: 0.98 mg/dL (ref 0.44–1.00)
GFR calc Af Amer: >60 mL/min (ref >60)
GFR calc non Af Amer: 52 mL/min — ABNORMAL LOW (ref >60)
GLUCOSE: 122 mg/dL — AB (ref 70–99)
Potassium: 3.8 mmol/L (ref 3.5–5.1)
Sodium: 146 mmol/L — ABNORMAL HIGH (ref 135–145)

## 2018-05-13 LAB — GLUCOSE, CAPILLARY
Glucose-Capillary: 90 mg/dL (ref 70–99)
Glucose-Capillary: 107 mg/dL — ABNORMAL HIGH (ref 70–99)
Glucose-Capillary: 89 mg/dL (ref 70–99)
Glucose-Capillary: 86 mg/dL (ref 70–99)

## 2018-05-13 MED ORDER — AMPICILLIN SODIUM 1 G IJ SOLR
1.0000 g | Freq: Four times a day (QID) | INTRAMUSCULAR | Status: DC
  Administered 2018-05-14 – 2018-05-15 (×5): 1 g via INTRAVENOUS
  Filled 2018-05-13: qty 1000
  Filled 2018-05-13: qty 1
  Filled 2018-05-13 (×3): qty 1000
  Filled 2018-05-13: qty 1
  Filled 2018-05-13: qty 1000
  Filled 2018-05-13 (×2): qty 1

## 2018-05-13 NOTE — Progress Notes (Signed)
PALLIATIVE NOTE:  Met briefly with husband today. We were scheduled to meet for goals of care discussion with him and his daughter, Donnie Mesa. Unfortunately Mr. Posey was under the impression that his daughter had contacted me because he stated she called him earlier and asked for my number. She was unable to leave work today due to some meetings. I contacted daughter and she verbalized her day was busy tomorrow as well, however she was going to try to get off and come meet at 3pm. Advised if we needed to have discussion and have her on the phone for convenience we could arrange, however, she would like to be present in person. Mr. Minetti does not want to have full discussion or make any decisions without his daughter's presence.   Delay in note and recommendations until I am able to meet with both husband and daughter.  We are scheduled to meet tomorrow 8/15 @ 1500. Detailed note and recommendations to follow.   Thank you for allowing me to be involved in patient's care.   Alda Lea, NP-BC Palliative Medicine Team  Phone: [phone] Fax: [phone] Pager: [phone] Amion: Delane Ginger. Cousar

## 2018-05-13 NOTE — Progress Notes (Signed)
[facility] - [place] at [facility]   PATIENT NAME: Claire Williams    MR#:  [id number]  DATE OF BIRTH:  08/09/1934  SUBJECTIVE:  CHIEF COMPLAINT:   Chief Complaint  Patient presents with  . Altered Mental Status   Non verbal, drowzy. Afebrile  Husband at bedside  REVIEW OF SYSTEMS:  Review of Systems  Unable to perform ROS: Dementia    DRUG ALLERGIES:   Allergies  Allergen Reactions  . Influenza Vaccines     Egg allergy  . Eggs Or Egg-Derived Products Other (See Comments)  . Sulfa Antibiotics Rash  . Sulfacetamide Sodium Rash   VITALS:  Blood pressure (!) 155/68, pulse 76, temperature 98.2 F (36.8 C), temperature source Oral, resp. rate 16, height 5\' 5"  (1.651 m), weight 45.2 kg, SpO2 100 %. PHYSICAL EXAMINATION:  Physical Exam  Constitutional:  Drowzy, non verbal  HENT:  Head: Normocephalic.  Mouth/Throat: Oropharynx is clear and moist.  Eyes: Pupils are equal, round, and reactive to light. Conjunctivae and EOM are normal. No scleral icterus.  Neck: Neck supple. No JVD present. No tracheal deviation present.  Cardiovascular: Normal rate, regular rhythm and normal heart sounds. Exam reveals no gallop.  No murmur heard. Pulmonary/Chest: Effort normal and breath sounds normal. No respiratory distress. She has no wheezes. She has no rales.  Abdominal: Soft. Bowel sounds are normal. She exhibits no distension. There is no tenderness. There is no rebound.  Musculoskeletal: She exhibits no edema or tenderness.  Neurological:  Demented, unable to exam.  Skin: No rash noted. No erythema.  Sacral decub stage III.    LABORATORY PANEL:  Female CBC Recent Labs  Lab 05/12/18 0602  WBC 9.1  HGB 13.1  HCT 38.9  PLT 264   ------------------------------------------------------------------------------------------------------------------ Chemistries  Recent Labs  Lab 05/11/18 1128  05/13/18 0407  NA 144   < > 146*  K 5.1   < > 3.8  CL 112*   <  > 117*  CO2 24   < > 23  GLUCOSE 168*   < > 122*  BUN 35*   < > 22  CREATININE 1.29*   < > 0.98  CALCIUM 9.5   < > 9.1  AST 31  --   --   ALT 12  --   --   ALKPHOS 73  --   --   BILITOT 1.5*  --   --    < > = values in this interval not displayed.   RADIOLOGY:  No results found. ASSESSMENT AND PLAN:   84 year old female with past medical history of advanced dementia, osteoarthritis, adult failure to thrive who presents to the hospital due to altered mental status/lethargy.  * Acute metabolic encephalopathy over worsening dementia due to UTI -Patient CT head was negative for acute pathology. No other etiology found No improvement with IV abx Ucx with enterococcus  * Urinary tract infection - enterococcus Change ceftriaxone to ampicillin  * Severe protein calorie malnutrition Unable to eat due to mental status  * Dehydration.  IV fluid support   * Sacral decub stage III.  Wound care by nurse.  All the records are reviewed and case discussed with Care Management/Social Worker. Management plans discussed with the patient, her daughter and husband and they are in agreement.  CODE STATUS: DNR  TOTAL TIME TAKING CARE OF THIS PATIENT: 35 minutes.   POSSIBLE D/C IN 1-2 DAYS, DEPENDING ON CLINICAL CONDITION.  Orie FishermanSrikar R Kandice Schmelter M.D on 05/13/2018 at 10:31 PM  Between 7am to 6pm - Pager - [phone]  After 6pm go to [email or web address] - Therapist, nutritionalpassword EPAS [facility]  [facility] [place] [facility]

## 2018-05-13 NOTE — Progress Notes (Signed)
Patient's daughter requesting to speak with Dr. Elpidio AnisSudini. Dr. Elpidio AnisSudini paged and will call into patient's room and speak with daughter at bedside.  Orson Apeanielle Aitan Rossbach, RN, BSN

## 2018-05-13 NOTE — Progress Notes (Signed)
Advance care planning  Patient drowzy and non verbal/ Unable to make own decisions. Husband is the HCPOA.  Met with husband at bedside. Also daughter over the phone.  Discussed regarding UTI, Dementia, Malnutrition. Patient has lost significant weight over past few months. Poor appetite. Bed bound. Also worsening dementia. She has had similar presentation in the past and never recovered full.  Discussed regarding malnutriiton and patient not eating any thing at this time. Recommended hospice at home. They want to further discuss with palliatice care in AM   DNR  Time spent 30 minutes

## 2018-05-13 NOTE — Progress Notes (Signed)
Care of patient taken over from [place], [place].  Patient resting comfrtably at this time.  Orson Apeanielle Juma Oxley, RN, BSN

## 2018-05-14 DIAGNOSIS — R400 Somnolence: Secondary | ICD-10-CM

## 2018-05-14 DIAGNOSIS — E860 Dehydration: Secondary | ICD-10-CM

## 2018-05-14 DIAGNOSIS — Z66 Do not resuscitate: Secondary | ICD-10-CM

## 2018-05-14 DIAGNOSIS — N390 Urinary tract infection, site not specified: Principal | ICD-10-CM

## 2018-05-14 DIAGNOSIS — Z515 Encounter for palliative care: Secondary | ICD-10-CM

## 2018-05-14 DIAGNOSIS — Z7189 Other specified counseling: Secondary | ICD-10-CM

## 2018-05-14 DIAGNOSIS — E43 Unspecified severe protein-calorie malnutrition: Secondary | ICD-10-CM

## 2018-05-14 LAB — URINE CULTURE: SPECIAL REQUESTS: NORMAL

## 2018-05-14 LAB — GLUCOSE, CAPILLARY
GLUCOSE-CAPILLARY: 73 mg/dL (ref 70–99)
Glucose-Capillary: 136 mg/dL — ABNORMAL HIGH (ref 70–99)
Glucose-Capillary: 180 mg/dL — ABNORMAL HIGH (ref 70–99)
Glucose-Capillary: 95 mg/dL (ref 70–99)

## 2018-05-14 MED ORDER — AMOXICILLIN 400 MG/5ML PO SUSR: 400.0000 mg | Freq: Three times a day (TID) | ORAL | 0 refills | Status: DC

## 2018-05-14 NOTE — Progress Notes (Signed)
Speech Therapy Note: reviewed chart notes; consulted NSG re: pt's status today. NSG reported pt is taking few po's w/ mod-max support/cues, mostly drinking some of the liquids. No overt s/s of aspiration have been reported. Noted family meeting w/ Palliative Care ongoing to establish goals of care. ST services will f/u as needed while pt is admitted. Pt appears to be managing the current diet adequately and safely enough; somewhat at her baseline as per family report at the evaluation. No upgrade in diet would be recommended at this time. Recommend continue w/ aspiration precautions during any oral intake. NSG agreed.    Jerilynn SomKatherine Watson, MS, CCC-SLP

## 2018-05-14 NOTE — Progress Notes (Signed)
New referral for [facility] services at home following a Palliative Medicine consult. [place] aware. Patient is an 84 year old woman with dementia admitted to [facility] from home on 8/12 for treatment of a UTI. She is currently receiving IV antibiotics and fluids. Please note, patient was referred at last admission but was receiving services from [place] through the [facility] which was incompatible with hospice services. Plan per family is to pay privately for sitters outside of this program. Writer met in the room with patient's husband Joneen Caraway, and  daughters Otila Kluver and Maudie Mercury to initiate education regarding hospice services, philosophy and team approach to care with understanding voiced. DME needs discussed and ordered for delivery tomorrow. Hospital bed will need to be in place prior to discharge. Patient seen lying in bed, eyes closed, appeared to be sleeping. Per chart review and family report she has not been eating, is bed bound, nonverbal and incontinent of bowel and bladder. Foley catheter to be placed prior to discharge, family in agreement. Hospice information  and contact numbers given To Mr. Ticas. Will continue to follow through final discharge. Hospital care team updated. Flo Shanks RN, BSN, [place] and [facility], hospital Liaison [phone]

## 2018-05-14 NOTE — Progress Notes (Signed)
[facility] [place] [facility]   PATIENT NAME: Claire Williams    MR#:  [id number]  DATE OF BIRTH:  [birth year]  SUBJECTIVE:  CHIEF COMPLAINT:   Chief Complaint  Patient presents with  . Altered Mental Status   Awake but non verbal. Daughter at bedside  REVIEW OF SYSTEMS:  Review of Systems  Unable to perform ROS: Dementia    DRUG ALLERGIES:   Allergies  Allergen Reactions  . Influenza Vaccines     Egg allergy  . Eggs Or Egg-Derived Products Other (See Comments)  . Sulfa Antibiotics Rash  . Sulfacetamide Sodium Rash   VITALS:  Blood pressure (!) 124/59, pulse 65, temperature 97.8 F (36.6 C), temperature source Oral, resp. rate 16, height 5\' 5"  (1.651 m), weight 45.2 kg, SpO2 97 %. PHYSICAL EXAMINATION:  Physical Exam  Constitutional:  Drowzy, non verbal  HENT:  Head: Normocephalic.  Mouth/Throat: Oropharynx is clear and moist.  Eyes: Pupils are equal, round, and reactive to light. Conjunctivae and EOM are normal. No scleral icterus.  Neck: Neck supple. No JVD present. No tracheal deviation present.  Cardiovascular: Normal rate, regular rhythm and normal heart sounds. Exam reveals no gallop.  No murmur heard. Pulmonary/Chest: Effort normal and breath sounds normal. No respiratory distress. She has no wheezes. She has no rales.  Abdominal: Soft. Bowel sounds are normal. She exhibits no distension. There is no tenderness. There is no rebound.  Musculoskeletal: She exhibits no edema or tenderness.  Neurological:  Demented, unable to exam.  Skin: No rash noted. No erythema.  Sacral decub stage III.    LABORATORY PANEL:  Female CBC Recent Labs  Lab 05/12/18 0602  WBC 9.1  HGB 13.1  HCT 38.9  PLT 264   ------------------------------------------------------------------------------------------------------------------ Chemistries  Recent Labs  Lab 05/11/18 1128  05/13/18 0407  NA 144   < > 146*  K 5.1   < > 3.8  CL 112*   < > 117*    CO2 24   < > 23  GLUCOSE 168*   < > 122*  BUN 35*   < > 22  CREATININE 1.29*   < > 0.98  CALCIUM 9.5   < > 9.1  AST 31  --   --   ALT 12  --   --   ALKPHOS 73  --   --   BILITOT 1.5*  --   --    < > = values in this interval not displayed.   RADIOLOGY:  No results found. ASSESSMENT AND PLAN:   84 year old female with past medical history of advanced dementia, osteoarthritis, adult failure to thrive who presents to the hospital due to altered mental status/lethargy.  * Acute metabolic encephalopathy over worsening dementia due to UTI -Patient CT head was negative for acute pathology. No other etiology found No improvement with IV abx Ucx with enterococcus  * Urinary tract infection - enterococcus Changed ceftriaxone to ampicillin  * Severe protein calorie malnutrition Unable to eat due to mental status  * Dehydration.  IV fluid support   * Sacral decub stage III.  Wound care by nurse.  Discharge home with hospice in AM  All the records are reviewed and case discussed with Care Management/Social Worker. Management plans discussed with the patient, her daughter and husband and they are in agreement.  CODE STATUS: DNR  TOTAL TIME TAKING CARE OF THIS PATIENT: 35 minutes.   POSSIBLE D/C IN 1-2 DAYS, DEPENDING ON CLINICAL CONDITION.  Gianmarco Roye R  Marion Seese M.D on 05/14/2018 at 6:21 PM  Between 7am to 6pm - Pager - [phone]  After 6pm go to [email or web address] - Therapist, nutritionalpassword EPAS [facility]  [facility] [place] [facility]

## 2018-05-14 NOTE — Care Management Important Message (Signed)
Important Message  Patient Details  Name: Claire Williams MRN: [id number] Date of Birth: 1934/06/01   Medicare Important Message Given:  Yes    Olegario MessierKathy A Lando Alcalde 05/14/2018, 11:10 AM

## 2018-05-14 NOTE — Care Management (Signed)
RNCM received notification from care team that patient's family would like [facility]. Clydie BraunKaren with hospice aware.

## 2018-05-14 NOTE — Consult Note (Signed)
Consultation Note Date: 05/14/2018   Patient Name: Claire Williams  DOB: June 19, 1934  MRN: [id number]  Age / Sex: 84 y.o., female  PCP: Lianne Moris, MD Referring Physician: Hillary Bow, MD  Reason for Consultation: Establishing goals of care  HPI/Patient Profile: 84 y.o. female admitted on 05/11/2018 from home with altered mental status and lethargy. She has a past medical history significant for advanced dementia, osteoarthritis, adult failure to thrive, diabetes, and sacral decubitus ulcer. Per family on admission, patient has advanced dementia and is total care, however over the weekend she became more lethargic and not arousable compared to baseline, which concerned her husband. Patient was admitted back in June for similar presentation and was noted to have a UTI. She was then treated and showed signs of improvement. During ED course showed many bacteria and moderate leukocytes. WBC 8.1. BUN 35. Creatinine 1.29. Since admission has shown no signs of improvement with IV antibiotic. She has been seen by wound care in regards to stage 3 sacral decub and by the dietician and SLP due to severe protein calorie malnutrition. Palliative Medicine team consulted for goals of care discussion.   Clinical Assessment and Goals of Care: I have reviewed medical records including lab results, imaging, Epic notes, and MAR, received report from the bedside RN and assessed the patient. I then met at the bedside with patient's husband, Joneen Caraway and their two daughters to discuss diagnosis prognosis, GOC, EOL wishes, disposition and options. Patient has advanced dementia. She is non-verbal and unable to engage in goals of care discussion with her family and I.   I re-introduced Palliative Medicine as specialized medical care for people living with serious illness. It focuses on providing relief from the symptoms and stress of a  serious illness. The goal is to improve quality of life for both the patient and the family. Patient was recently admitted in June 2019 and I had the pleasure of being involved in her care and meeting her family during that admission as well. Family appreciated to have me involved in her care again this admission.   We discussed a brief life review of the patient. Patient and her husband have been married for more than 60 years. They have 2 daughters. Patient is a reired Licensed conveyancer and loved reading and spending time at [employer] with family.   As far as functional and nutritional status family reports since her last admission patient began doing a little better only to begin to show signs of decline again over the past 3-4 weeks. Husband reports patient began to have a decrease in appetite. She has caregivers from [facility] coming into the home to assist with ADLs. Patient is total care and dependent on care including feeding assistance. Husband reports patient began refusing to eat and has been sleeping more than usual. She has developed a sacral decub due to increased incontinence. Family reports several days prior to admission patient only slept and hardly ate or drank anything despite prompting and multiple attempts. They feel that she has only  continued to lose weight since her last admission.   We discussed her current illness and what it means in the larger context of her on-going co-morbidities.  Natural disease trajectory and expectations at EOL were discussed. Husband and daughters all verbalized understanding of patient's current illness and progression of dementia. They are hopeful that she would show some signs of improvement, but feel that she has not shown improvement this time and that she may be actively attempting to pass away. They all verbalized at this point their goal is for her to be kept comfortable in the home and not to suffer during her end of life transition.   I attempted to elicit  values and goals of care important to the patient and family.    The difference between aggressive medical intervention and comfort care was considered in light of the patient's goals of care. Family verbalizes that patient is not showing any signs of improvement and feel that she has been declining over the pass month. Daughter verbalizes that patient would not want to continue living in her current state and coming back and forth to the hospital. They are now requesting comfort measures at discharge. We discussed in detail what comfort measures involve. We discussed that patient would receive care in the home that focuses on comfort and managing symptoms such as agitation, shortness of breath, pain, or discomfort. Patient would not receive aggressive medical interventions such as IV fluids, antibiotics, labwork, or radiology testing. Given their goal to keep patient in the home and not to transport back and forth to the hospital family aware that with hospice they will make very attempt to care and manage patient in the comfort of her home based on their goals for her. Family verbalized understanding and agreement.   Advanced directives, concepts specific to code status, artifical feeding and hydration, and rehospitalization were considered and discussed. Husband confirmed that patient is a DNR/DNI and they have no desire for artificial feedings such as PEG tube.   Hospice and Palliative Care services outpatient were explained and offered. We briefly discussed the goals of hospice and palliative. At this time family is focusing on Mrs. Wymer comfort and would like to initiate comfort measures with hospice support. They verbalizes they would require a hospital bed in the home as well.   Daughter verbalizes that patient is currently receiving care through [facility] home care services based on grant and part private pay. They have spoken to the RN case manager and have expressed their potential wishes for  patient to return home with hospice. They are aware that they can not have both services given [facility] is a grant based program. Family has opted to have hospice in the home for end of life support and privately pay for additional care to support the husband in the home during the day as both daughters work and unable to be present for support.   Questions and concerns were addressed. The family was encouraged to call with questions or concerns.  PMT will continue to support holistically.  Primary Decision Maker:  HCPOA/HUSBAND: Elon Jester    SUMMARY OF RECOMMENDATIONS    DNR/DNI-as confirmed by husband and daughters  Continue to treat the treatable while hospitalized without escalation of care. Family would like to continue IV fluids and antibiotics until discharged. Family has verbalized goal is to get patient home in the next day or so with outpatient hospice support and private duty home care support.   Case Manager consult for outpatient hospice  care and home equipment.   Patient will need non-emergent transportation home also.   Patient has a stage 3 sacral pressure injury to coccyx. At this time family is concerned that patient will continue to have skin breakdown due to incontinence and cause increased pain. Although patient is being treated for UTI will insert foley cath for comfort and end of life care. Patient will be discharged with catheter.   Palliative Medicine team will continue to support patient, family, and medical team during hospitalization.   Code Status/Advance Care Planning:  DNR/DNI-as confirmed by husband and family.    Palliative Prophylaxis:   Aspiration, Bowel Regimen, Frequent Pain Assessment, Oral Care, Palliative Wound Care and Turn Reposition  Additional Recommendations (Limitations, Scope, Preferences):  Full Scope Treatment-continue to treat the treatable while hospitalized. NO escalation of care   Psycho-social/Spiritual:   Desire for  further Chaplaincy support:NO  Prognosis:   < 6 weeks-in the setting of decreased to no po intake, severe protein calorie malnutrition, weight loss of aprox. 20lb in past 3 months, immobility, advanced dementia, nonverbal, recurrent UTI, sacral decub stage 3, diabetes, dehydration, and osteoarthritis.   Discharge Planning: Home with Hospice      Primary Diagnoses: Present on Admission: . UTI (urinary tract infection)   I have reviewed the medical record, interviewed the patient and family, and examined the patient. The following aspects are pertinent.  Past Medical History:  Diagnosis Date  . Arthritis   . Dementia   . Diabetes mellitus without complication ([place])   . Sacral decubitus ulcer    Social History   Socioeconomic History  . Marital status: Married    Spouse name: Not on file  . Number of children: Not on file  . Years of education: Not on file  . Highest education level: Not on file  Occupational History  . Not on file  Social Needs  . Financial resource strain: Not on file  . Food insecurity:    Worry: Not on file    Inability: Not on file  . Transportation needs:    Medical: Not on file    Non-medical: Not on file  Tobacco Use  . Smoking status: Former Research scientist (life sciences)  . Smokeless tobacco: Never Used  Substance and Sexual Activity  . Alcohol use: No  . Drug use: No  . Sexual activity: Not on file  Lifestyle  . Physical activity:    Days per week: Not on file    Minutes per session: Not on file  . Stress: Not on file  Relationships  . Social connections:    Talks on phone: Not on file    Gets together: Not on file    Attends religious service: Not on file    Active member of club or organization: Not on file    Attends meetings of clubs or organizations: Not on file    Relationship status: Not on file  Other Topics Concern  . Not on file  Social History Narrative  . Not on file   Family History  Family history unknown: Yes   Scheduled Meds: .  enoxaparin (LOVENOX) injection  30 mg Subcutaneous Q24H  . feeding supplement (ENSURE ENLIVE)  237 mL Oral BID BM  . insulin aspart  0-9 Units Subcutaneous TID WC  . nystatin  1 g Topical BID   Continuous Infusions: . sodium chloride 75 mL/hr at 05/14/18 1057  . ampicillin (OMNIPEN) IV Stopped (05/14/18 1308)   PRN Meds:.acetaminophen **OR** acetaminophen, ondansetron **OR** ondansetron (ZOFRAN) IV Medications  Prior to Admission:  Prior to Admission medications   Medication Sig Start Date End Date Taking? Authorizing Provider  amoxicillin (AMOXIL) 400 MG/5ML suspension Take 5 mLs (400 mg total) by mouth 3 (three) times daily for 5 days. 05/14/18 05/19/18  Hillary Bow, MD  ciclopirox (PENLAC) 8 % solution Apply topically at bedtime. Apply to nail/surrounding skin and daily over previous coat. Every 7 days remove with alcohol and continue. Patient not taking: Reported on 03/28/2018 05/08/15   Trula Slade, DPM  nystatin (MYCOSTATIN) powder Apply 1 g topically 2 (two) times daily as needed.  12/10/12   [provider]   Allergies  Allergen Reactions  . Influenza Vaccines     Egg allergy  . Eggs Or Egg-Derived Products Other (See Comments)  . Sulfa Antibiotics Rash  . Sulfacetamide Sodium Rash   Review of Systems  Unable to perform ROS: Dementia    Physical Exam  Constitutional: She appears cachectic. She has a sickly appearance.  Frail and thin in appearance   Cardiovascular: Normal rate, regular rhythm, normal heart sounds and normal pulses.  Pulmonary/Chest: Effort normal. She has decreased breath sounds.  Abdominal: Soft. Normal appearance. Bowel sounds are decreased.  Genitourinary:  Genitourinary Comments: Incontinent   Neurological: She is disoriented and unresponsive. She displays atrophy.  Advanced dementia   Skin: Skin is warm and dry.     Psychiatric: Cognition and memory are impaired. She expresses inappropriate judgment. She is noncommunicative.    Nursing note and vitals reviewed.   Vital Signs: BP (!) 151/77 (BP Location: Right Arm)   Pulse (!) 54   Temp 98.4 F (36.9 C) (Oral)   Resp 16   Ht '5\' 5"'$  (1.651 m)   Wt 45.2 kg   SpO2 100%   BMI 16.57 kg/m  Pain Scale: Faces   Pain Score: Asleep   SpO2: SpO2: 100 % O2 Device:SpO2: 100 % O2 Flow Rate: .   IO: Intake/output summary:   Intake/Output Summary (Last 24 hours) at 05/14/2018 1545 Last data filed at 05/14/2018 1454 Gross per 24 hour  Intake 6180.21 ml  Output 1400 ml  Net 4780.21 ml    LBM: Last BM Date: 05/12/18 Baseline Weight: Weight: 50.4 kg Most recent weight: Weight: 45.2 kg     Palliative Assessment/Data: PPS 20 %   Time In: 1430 Time Out: 1600 Time Total: 90 min.   Greater than 50%  of this time was spent counseling and coordinating care related to the above assessment and plan.  Signed by:  Alda Lea, NP-BC Palliative Medicine Team  Phone: [phone] Fax: [phone] Pager: [phone] Amion: Bjorn Pippin    Please contact Palliative Medicine Team phone at [phone] for questions and concerns.  For individual provider: See Shea Evans

## 2018-05-15 DIAGNOSIS — L89153 Pressure ulcer of sacral region, stage 3: Secondary | ICD-10-CM

## 2018-05-15 LAB — GLUCOSE, CAPILLARY
GLUCOSE-CAPILLARY: 76 mg/dL (ref 70–99)
GLUCOSE-CAPILLARY: 97 mg/dL (ref 70–99)

## 2018-05-15 NOTE — Discharge Summary (Signed)
[facility] [place] [facility]   PATIENT NAME: Claire Williams    MR#:  [id number]  DATE OF BIRTH:  1934-07-30  DATE OF ADMISSION:  05/11/2018 ADMITTING PHYSICIAN: Houston SirenVivek J Sainani, MD  DATE OF DISCHARGE: 05/15/2018  PRIMARY CARE PHYSICIAN: Elvia CollumAleman, Marco A, MD   ADMISSION DIAGNOSIS:  Dehydration [E86.0] Somnolence [R40.0] Urinary tract infection, acute [N39.0]  DISCHARGE DIAGNOSIS:  Active Problems:   UTI (urinary tract infection)   Protein-calorie malnutrition, severe   Pressure injury of skin   SECONDARY DIAGNOSIS:   Past Medical History:  Diagnosis Date  . Arthritis   . Dementia   . Diabetes mellitus without complication (H[facility])   . Sacral decubitus ulcer      ADMITTING HISTORY  HISTORY OF PRESENT ILLNESS:  Claire Squibbancy Stroebel  is a 84 y.o. female with a known history of advanced dementia, osteoarthritis, adult failure to thrive who presents to the hospital from home due to altered mental status/lethargy.  Patient has advanced dementia and is usually total care but over the past weekend she has been more sleepy and lethargic, she is not arousable and therefore the husband was concerned and brought her to the ER.  She had a similar admission in June of this past year and was noted to have urinary tract infection and improved with IV antibiotics.  She presents to the ER and was noted to have a UTI based off a urinalysis.  Hospitalist services were contacted for admission.  Per the husband patient had no fever, but her urine did smell foul.    HOSPITAL COURSE:   84 year old female with past medical history of advanced dementia, osteoarthritis, adult failure to thrive who presents to the hospital due to altered mental status/lethargy  * Acute metabolic encephalopathy over worsening dementia due to UTI and worsening COPD -Patient CT head was negative for acute pathology. Ucx with enterococcus. Treated with IV ampicillin.  *Urinary tract infection -  enterococcus Changed ceftriaxone to ampicillin  * Severe protein calorie malnutrition Unable to eat due to mental status  * Dehydration.  IV fluid support in hospital  * Sacral decub stage III.    Patient did not improve with IV antibiotics and IV fluids.  Continues to have severe malnutrition and unable to meet nutritional needs.  Palliative care was consulted.  Family discussions held.  At this time decision made by family to take patient home with hospice services for end-of-life care. Foley catheter placed for comfort.  Discharge home with hospice.   CONSULTS OBTAINED:    DRUG ALLERGIES:   Allergies  Allergen Reactions  . Influenza Vaccines     Egg allergy  . Eggs Or Egg-Derived Products Other (See Comments)  . Sulfa Antibiotics Rash  . Sulfacetamide Sodium Rash    DISCHARGE MEDICATIONS:   Allergies as of 05/15/2018      Reactions   Influenza Vaccines    Egg allergy   Eggs Or Egg-derived Products Other (See Comments)   Sulfa Antibiotics Rash   Sulfacetamide Sodium Rash      Medication List    TAKE these medications   ciclopirox 8 % solution Commonly known as:  PENLAC Apply topically at bedtime. Apply to nail/surrounding skin and daily over previous coat. Every 7 days remove with alcohol and continue.   nystatin powder Commonly known as:  MYCOSTATIN/NYSTOP Apply 1 g topically 2 (two) times daily as needed.       Today   VITAL SIGNS:  Blood pressure (!) 161/71, pulse 68, temperature  98 F (36.7 C), temperature source Oral, resp. rate 16, height 5\' 5"  (1.651 m), weight 45.2 kg, SpO2 100 %.  I/O:    Intake/Output Summary (Last 24 hours) at 05/15/2018 1234 Last data filed at 05/15/2018 0114 Gross per 24 hour  Intake 1312.77 ml  Output 400 ml  Net 912.77 ml    PHYSICAL EXAMINATION:  Physical Exam  GENERAL:  84 y.o.-year-old patient lying in the bed LUNGS: Normal breath sounds bilaterally.No use of accessory muscles of respiration.   CARDIOVASCULAR: S1, S2 ABDOMEN: Soft, non-tender PSYCHIATRIC: The patient is drowzy. Non verbal  DATA REVIEW:   CBC Recent Labs  Lab 05/12/18 0602  WBC 9.1  HGB 13.1  HCT 38.9  PLT 264    Chemistries  Recent Labs  Lab 05/11/18 1128  05/13/18 0407  NA 144   < > 146*  K 5.1   < > 3.8  CL 112*   < > 117*  CO2 24   < > 23  GLUCOSE 168*   < > 122*  BUN 35*   < > 22  CREATININE 1.29*   < > 0.98  CALCIUM 9.5   < > 9.1  AST 31  --   --   ALT 12  --   --   ALKPHOS 73  --   --   BILITOT 1.5*  --   --    < > = values in this interval not displayed.    Cardiac Enzymes No results for input(s): TROPONINI in the last 168 hours.  Microbiology Results  Results for orders placed or performed during the hospital encounter of 05/11/18  Urine Culture     Status: Abnormal   Collection Time: 05/11/18 11:28 AM  Result Value Ref Range Status   Specimen Description   Final    URINE, RANDOM Performed at [facility], [address], [place], [place] [address]    Special Requests   Final    Normal Performed at [facility], [address] [place], [place] [address]    Culture >=100,000 COLONIES/mL ENTEROCOCCUS FAECALIS (A)  Final   Report Status 05/14/2018 FINAL  Final   Organism ID, Bacteria ENTEROCOCCUS FAECALIS (A)  Final      Susceptibility   Enterococcus faecalis - MIC*    AMPICILLIN <=2 SENSITIVE Sensitive     LEVOFLOXACIN 1 SENSITIVE Sensitive     NITROFURANTOIN <=16 SENSITIVE Sensitive     VANCOMYCIN 2 SENSITIVE Sensitive     * >=100,000 COLONIES/mL ENTEROCOCCUS FAECALIS    RADIOLOGY:  No results found.  Follow up with PCP in 1 week.  Management plans discussed with the patient, family and they are in agreement.  CODE STATUS:     Code Status Orders  (From admission, onward)         Start     Ordered   05/11/18 1546  Do not attempt resuscitation (DNR)  Continuous    Question Answer Comment  In the event of cardiac or respiratory  ARREST Do not call a "code blue"   In the event of cardiac or respiratory ARREST Do not perform Intubation, CPR, defibrillation or ACLS   In the event of cardiac or respiratory ARREST Use medication by any route, position, wound care, and other measures to relive pain and suffering. May use oxygen, suction and manual treatment of airway obstruction as needed for comfort.      05/11/18 1545        Code Status History  Date Active Date Inactive Code Status Order ID Comments User Context   03/30/2018 1439 03/31/2018 2047 DNR [id number]  Glee ArvinPickenpack-Cousar, Athena N, NP Inpatient   03/28/2018 1655 03/30/2018 1439 Full Code [id number]  Enid BaasKalisetti, Radhika, MD Inpatient    Advance Directive Documentation     Most Recent Value  Type of Advance Directive  Out of facility DNR (pink MOST or yellow form)  Pre-existing out of facility DNR order (yellow form or pink MOST form)  Yellow form placed in chart (order not valid for inpatient use)  "MOST" Form in Place?  -      TOTAL TIME TAKING CARE OF THIS PATIENT ON DAY OF DISCHARGE: more than 30 minutes.   Orie FishermanSrikar R Alley Neils M.D on 05/15/2018 at 12:34 PM  Between 7am to 6pm - Pager - [phone]  After 6pm go to [email or web address] - password [email or web address] [facility]  [facility]  Office  [phone]  CC: Primary care physician; Elvia CollumAleman, Marco A, MD  Note: This dictation was prepared with Dragon dictation along with smaller phrase technology. Any transcriptional errors that result from this process are unintentional.

## 2018-05-15 NOTE — Care Management (Signed)
Discharge home with [facility].  Travel by EMS.

## 2018-05-15 NOTE — Progress Notes (Signed)
Patient discharged home with hospice. All discharge instructions given to husband. EMS to transport home.

## 2018-05-15 NOTE — Progress Notes (Signed)
Daily Progress Note   Patient Name: Claire Williams       Date: 05/15/2018 DOB: 05/13/1934  Age: 84 y.o. MRN#: [id number] Attending Physician: Hillary Bow, MD Primary Care Physician: Lianne Moris, MD Admit Date: 05/11/2018  Reason for Consultation/Follow-up: Establishing goals of care and Non pain symptom management  Subjective: Patient resting in bed. She appears comfortable. No signs of pain or discomfort. Family at bedside (husband and daughter). Patient has eyes open but is nonverbal at baseline. Briefly reviewed goals of care discussion yesterday with family. Both husband and daughter verbalized understanding. Family verbalizes that equipment is being brought to the home today and will be set up prior to patient being discharged home. Family met with Hospice Liaison, Santiago Glad, RN on yesterday and agree with all requirements. Family verbalized their understanding of hospice services and how this aligns with their goals for comfort measures in the home for patient.   Family is concerned that staff was unsuccessful with catheter placement. They verbalized they would really like this to be done before discharge. They are concerned patient will began experiencing pain or sacral decub will continue to worsen due to incontinence. Bedside RN made aware and informed me that 3 RNs had attempted with no success. RN at bedside to assist me with insertion. Catheter was inserted with immediate urine return. Patient tolerated well. Family was educated on home use of catheter and the need for cleaning around insertion throughout the day and after bowel movements. Family verbalized understanding and appreciation.   Chart reviewed and report received from bedside RN.    Length of Stay: 4  Current  Medications: Scheduled Meds:  . feeding supplement (ENSURE ENLIVE)  237 mL Oral BID BM  . insulin aspart  0-9 Units Subcutaneous TID WC  . nystatin  1 g Topical BID    Continuous Infusions: . sodium chloride Stopped (05/15/18 1119)  . ampicillin (OMNIPEN) IV Stopped (05/15/18 5927)    PRN Meds: acetaminophen **OR** acetaminophen, ondansetron **OR** ondansetron (ZOFRAN) IV  Physical Exam  Constitutional: She appears cachectic. She has a sickly appearance.  Thin and frail in appearance   Cardiovascular: Normal rate, regular rhythm, normal heart sounds and normal pulses.  Pulmonary/Chest: Effort normal. She has decreased breath sounds.  Genitourinary:  Genitourinary Comments: Some swelling and redness noted   Neurological: She is  unresponsive.  Skin: Skin is warm, dry and intact.  Nursing note and vitals reviewed.           Vital Signs: BP (!) 161/71   Pulse 68   Temp 98 F (36.7 C) (Oral)   Resp 16   Ht _0  (1.651 m)   Wt 45.2 kg   SpO2 100%   BMI 16.57 kg/m  SpO2: SpO2: 100 % O2 Device: O2 Device: Room Air O2 Flow Rate:    Intake/output summary:   Intake/Output Summary (Last 24 hours) at 05/15/2018 1315 Last data filed at 05/15/2018 0114 Gross per 24 hour  Intake 1074.77 ml  Output 400 ml  Net 674.77 ml   LBM: Last BM Date: 05/12/18 Baseline Weight: Weight: 50.4 kg Most recent weight: Weight: 45.2 kg      Palliative Assessment/Data: PPS 20%     Patient Active Problem List   Diagnosis Date Noted  . Protein-calorie malnutrition, severe 05/12/2018  . Pressure injury of skin 05/12/2018  . UTI (urinary tract infection) 03/28/2018   Palliative Care Assessment & Plan   Patient Profile: 84 y.o. female admitted on 05/11/2018 from home with altered mental status and lethargy. She has a past medical history significant for advanced dementia, osteoarthritis, adult failure to thrive, diabetes, and sacral decubitus ulcer. Per family on admission, patient has  advanced dementia and is total care, however over the weekend she became more lethargic and not arousable compared to baseline, which concerned her husband. Patient was admitted back in June for similar presentation and was noted to have a UTI. She was then treated and showed signs of improvement. During ED course showed many bacteria and moderate leukocytes. WBC 8.1. BUN 35. Creatinine 1.29. Since admission has shown no signs of improvement with IV antibiotic. She has been seen by wound care in regards to stage 3 sacral decub and by the dietician and SLP due to severe protein calorie malnutrition. Palliative Medicine team consulted for goals of care discussion.  Recommendations/Plan:  DNR/DNI-as confirmed by husband and daughters  Continue to treat the treatable while hospitalized without escalation of care. Family would like to continue IV fluids and antibiotics until discharged. Home equipment to be set-up today per hospice.   Hospice care in the home at discharge. Family verbalizes they have spoken with hospice liaison, Santiago Glad, RN and aware of equipment arrangements.   Foley catheter to remain in place at discharge for end of life care and stage 3 sacral decub. Family have been educated on care of foley in the home.   Palliative Medicine team will continue to support patient, family, and medical team as needed while hospitalized.   Goals of Care and Additional Recommendations:  Limitations on Scope of Treatment: Full Scope Treatment  Code Status:    Code Status Orders  (From admission, onward)         Start     Ordered   05/11/18 1546  Do not attempt resuscitation (DNR)  Continuous    Question Answer Comment  In the event of cardiac or respiratory ARREST Do not call a "code blue"   In the event of cardiac or respiratory ARREST Do not perform Intubation, CPR, defibrillation or ACLS   In the event of cardiac or respiratory ARREST Use medication by any route, position, wound care, and  other measures to relive pain and suffering. May use oxygen, suction and manual treatment of airway obstruction as needed for comfort.      05/11/18 1545  Code Status History    Date Active Date Inactive Code Status Order ID Comments User Context   03/30/2018 1439 03/31/2018 2047 DNR [id number]  Jimmy Footman, NP Inpatient   03/28/2018 1655 03/30/2018 1439 Full Code [id number]  Gladstone Lighter, MD Inpatient    Advance Directive Documentation     Most Recent Value  Type of Advance Directive  Out of facility DNR (pink MOST or yellow form)  Pre-existing out of facility DNR order (yellow form or pink MOST form)  Yellow form placed in chart (order not valid for inpatient use)  "MOST" Form in Place?  -     Prognosis:   < 6 weeks-in the setting of decreased to no po intake, severe protein calorie malnutrition, weight loss of aprox. 20lb in past 3 months, immobility, advanced dementia, nonverbal, recurrent UTI, sacral decub stage 3, diabetes, dehydration, and osteoarthritis.  Discharge Planning:  Home with Hospice  Care plan was discussed with patient's family, bedside RN, and Dr. Darvin Neighbours.   Thank you for allowing the Palliative Medicine Team to assist in the care of this patient.   Total Time 45 min.  Prolonged Time Billed NO        Greater than 50%  of this time was spent counseling and coordinating care related to the above assessment and plan.  Alda Lea, NP-BC Palliative Medicine Team  Phone: [phone] Fax: [phone] Pager: [phone] Amion: Bjorn Pippin   Please contact Palliative Medicine Team phone at [phone] for questions and concerns.

## 2018-05-15 NOTE — Progress Notes (Addendum)
Order to place foley.  Attempted x 3 with Durwin RegesJackie RN, August SaucerBennie RN, and Henriette CombsSarah Adrianah Prophete RN all attempting to place foley.  Pt placed in different positions but still unable to successfully place foley.  Per AC, let pt rest and have day shift attempt  In morning.  Pt is incontinent and there is no retention of urine. Henriette CombsSarah Johanthan Kneeland RN

## 2018-05-15 NOTE — Progress Notes (Signed)
Follow up visit made to new referral for [facility] services at home. Patient seen lying in bed, respirations very shallow, eyes closed, no verbal response to name. Family at bedside, reports bed to be delivered today between 10 and 12. Foley placed by NP Geralynn RileNiki Pickenpack-Cousar. Family appreciative as patient does have a stage III sacral wound. Signed DNR in place in discharge packet. Patient will discharge home via EMS when bed is delivered.  Dayna BarkerKaren Robertson RN, BSN, [facility], hospital liaison [phone]

## 2018-07-31 DEATH — deceased

## 2019-10-14 IMAGING — CT CT HEAD W/O CM
3 series · 15 of 47 positions shown, 18 images · non-contrast
Comparison: March 28, 2018

CLINICAL DATA: Altered mental status with decreased responsiveness

EXAM:
CT HEAD WITHOUT CONTRAST
TECHNIQUE: Contiguous axial images were obtained from the base of the skull
through the vertex without intravenous contrast.

[Series 2: head wo · axial · 0.42mm/px · z∈[+293,+418]mm · 9 of 31 slices shown, 12 images]
[im 3/31  brain]
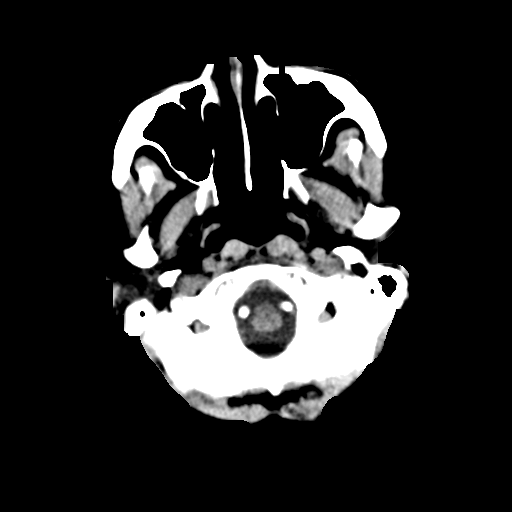
[im 3/31  bone]
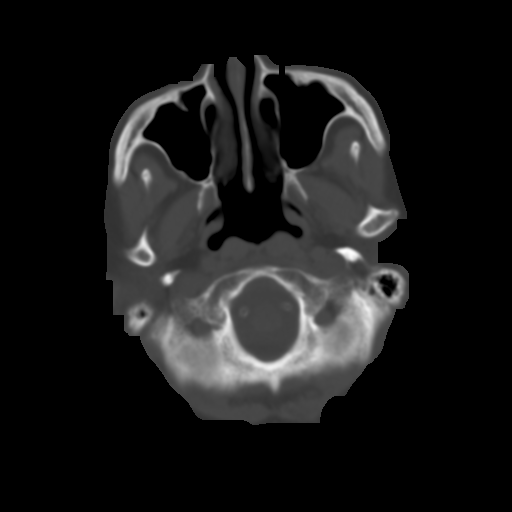
[im 6/31  brain]
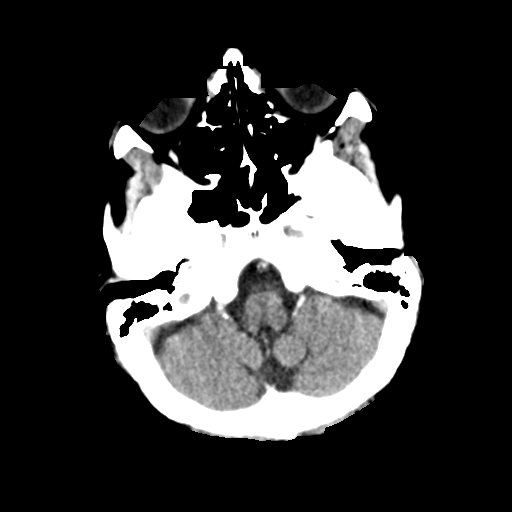
[im 9/31  brain]
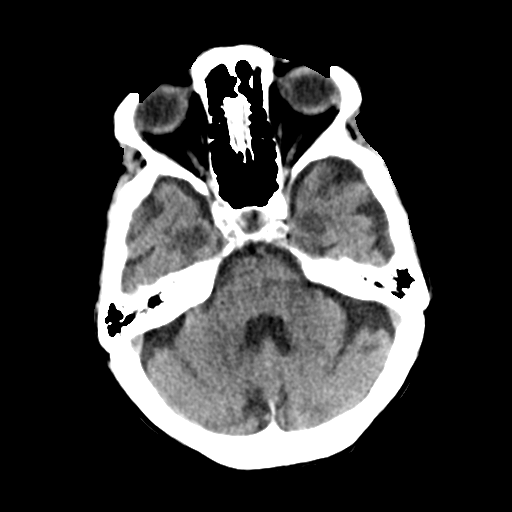
[im 12/31  brain]
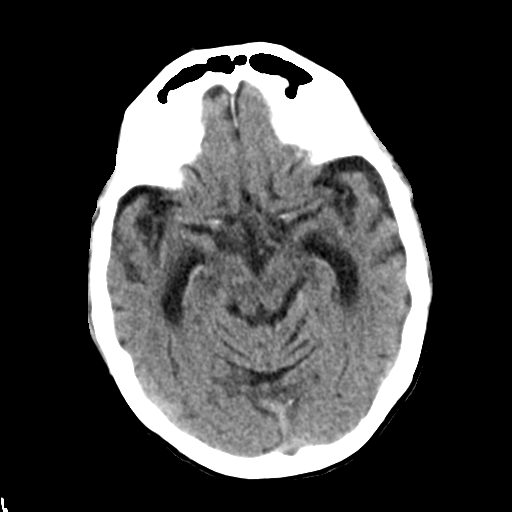
[im 16/31  brain]
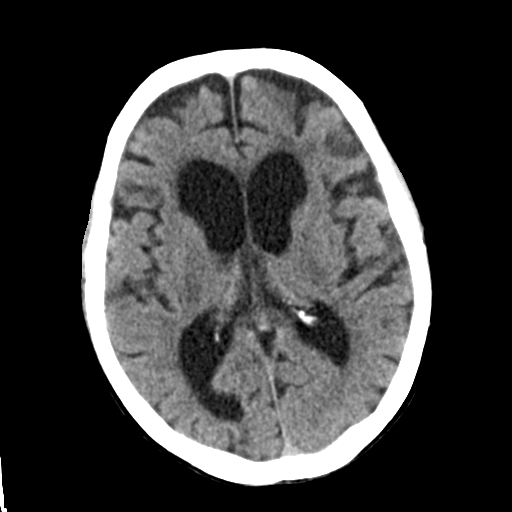
[im 16/31  bone]
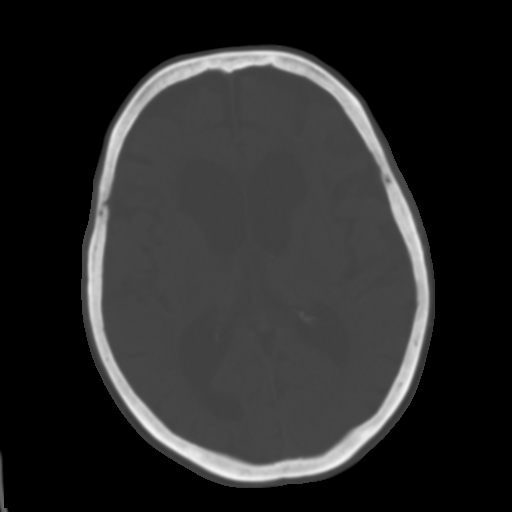
[im 19/31  brain]
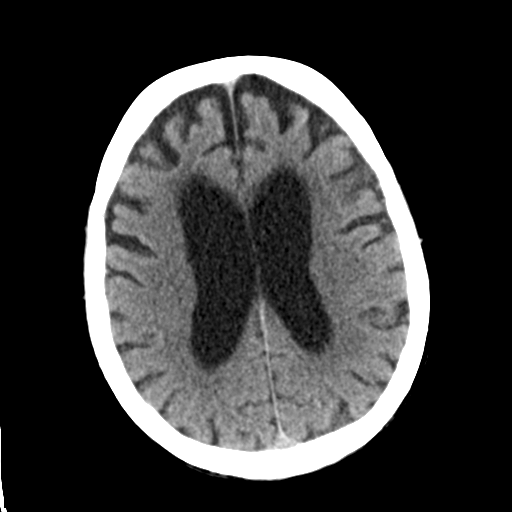
[im 22/31  brain]
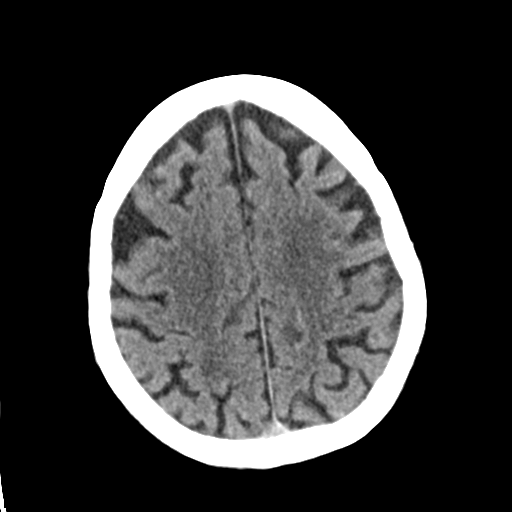
[im 25/31  brain]
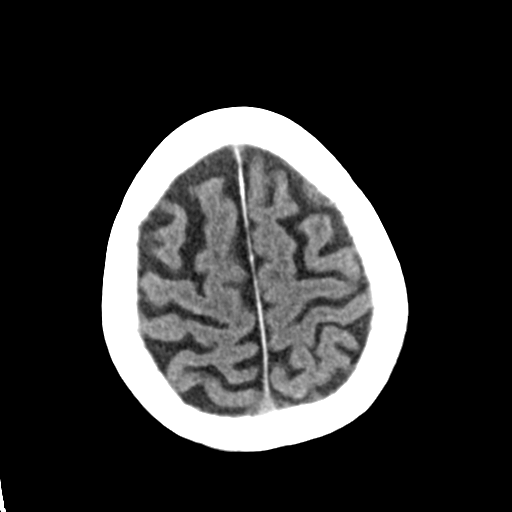
[im 28/31  brain]
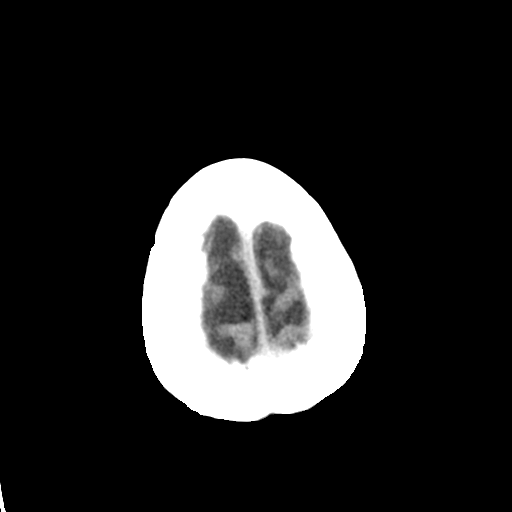
[im 28/31  bone]
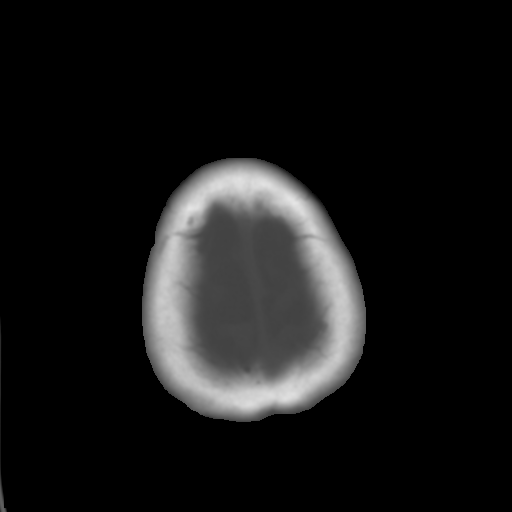

[Series 4: coronal soft tissue · coronal · 0.31mm/px · 3 of 65 slices shown]
[im 22/65  brain]
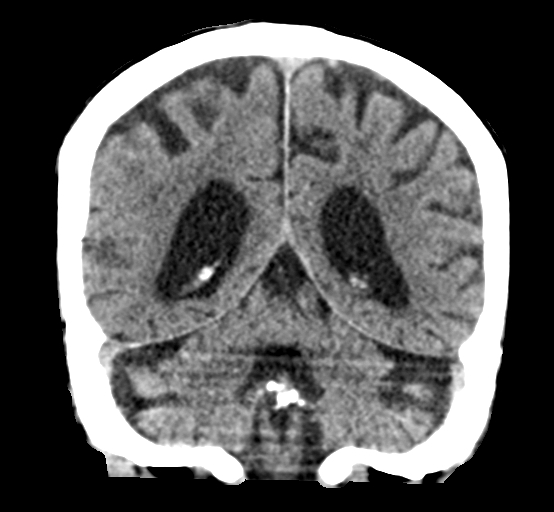
[im 29/65  brain]
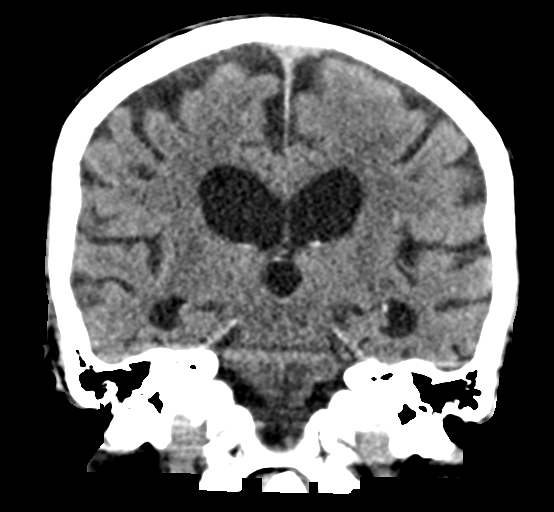
[im 36/65  brain]
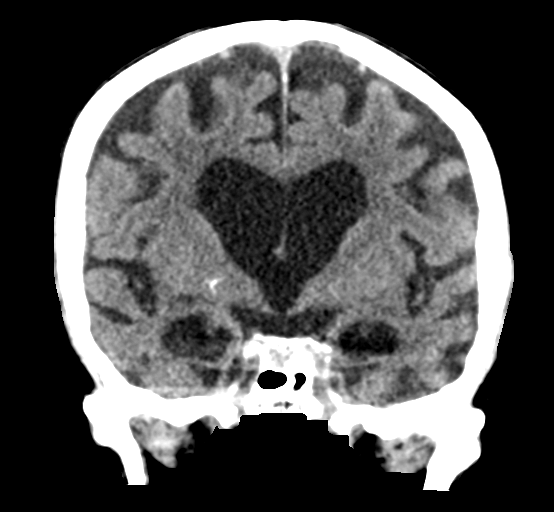

[Series 5: sagittal soft tissue · sagittal · 0.33mm/px · 3 of 52 slices shown]
[im 18/52  brain]
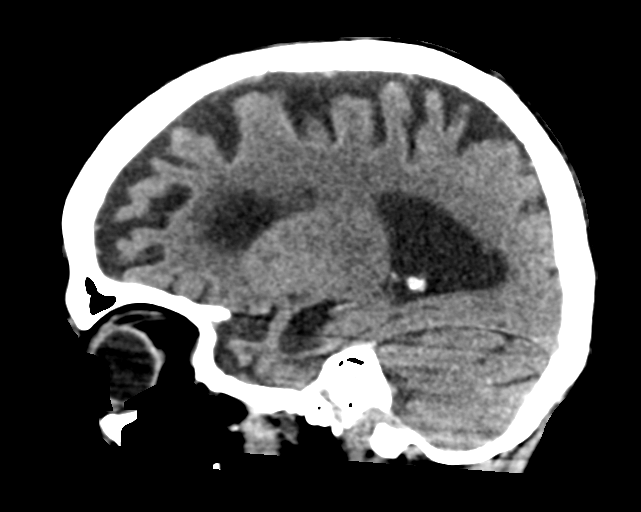
[im 26/52  brain]
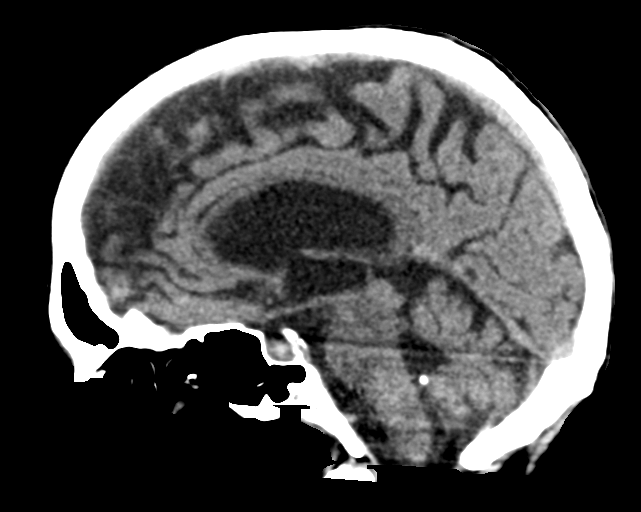
[im 35/52  brain]
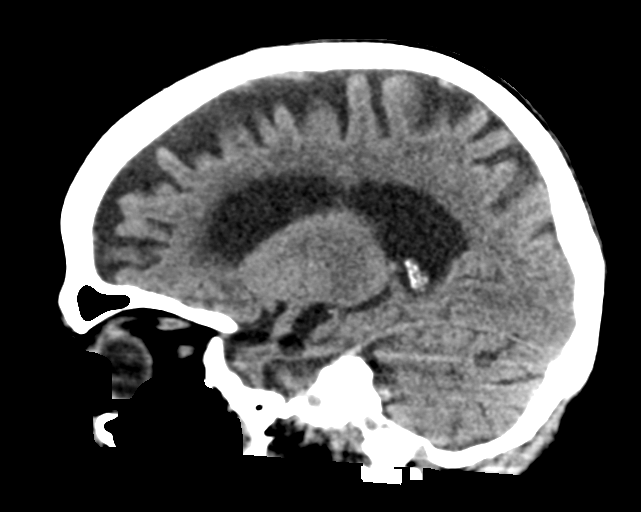

[15 of 47 positions shown; findings below may reference images not displayed]

FINDINGS: Brain: Moderate diffuse atrophy is again noted, stable. There is no
intracranial mass, hemorrhage, extra-axial fluid collection, or
midline shift. There is patchy small vessel disease in the centra
semiovale bilaterally, stable. No new gray-white compartment lesions
evident. No acute infarct appreciable.

Vascular: No hyperdense vessel. There is calcification in each
distal vertebral artery as well as in each carotid siphon and
proximal middle cerebral artery.

Skull: Bony calvarium appears intact.

Sinuses/Orbits: There is focal opacity in a posterior left ethmoid
air cell. There is mucosal thickening in multiple ethmoid air cells
as well. Other paranasal sinuses which are visualized are clear.
Orbits appear symmetric bilaterally except for previous cataract
removal on the left.

Other: Mastoid air cells are clear.
IMPRESSION: Stable atrophy with periventricular small vessel disease. No acute
infarct evident. No mass or hemorrhage.

There are foci of arterial vascular calcification. There is ethmoid
sinus disease.
